# Patient Record
Sex: Female | Born: 1980 | Race: White | Hispanic: Yes | Marital: Single | Smoking: Never smoker
Health system: Southern US, Community
[De-identification: ages and names within clinical notes are randomized; demographics above are authoritative.]

## PROBLEM LIST (undated history)

## (undated) DIAGNOSIS — G43909 Migraine, unspecified, not intractable, without status migrainosus: Secondary | ICD-10-CM

## (undated) HISTORY — PX: LASER ABLATION: SHX1947

---
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
## 2001-10-12 ENCOUNTER — Emergency Department (HOSPITAL_COMMUNITY): Admission: EM | Admit: 2001-10-12 | Discharge: 2001-10-12 | Payer: Self-pay | Admitting: Emergency Medicine

## 2001-10-17 ENCOUNTER — Emergency Department (HOSPITAL_COMMUNITY): Admission: EM | Admit: 2001-10-17 | Discharge: 2001-10-17 | Payer: Self-pay | Admitting: *Deleted

## 2002-02-02 ENCOUNTER — Ambulatory Visit (HOSPITAL_COMMUNITY): Admission: RE | Admit: 2002-02-02 | Discharge: 2002-02-02 | Payer: Self-pay | Admitting: *Deleted

## 2002-06-24 ENCOUNTER — Inpatient Hospital Stay (HOSPITAL_COMMUNITY): Admission: AD | Admit: 2002-06-24 | Discharge: 2002-06-24 | Payer: Self-pay | Admitting: *Deleted

## 2002-07-01 ENCOUNTER — Encounter: Admission: RE | Admit: 2002-07-01 | Discharge: 2002-07-01 | Payer: Self-pay | Admitting: Obstetrics and Gynecology

## 2002-07-03 ENCOUNTER — Inpatient Hospital Stay (HOSPITAL_COMMUNITY): Admission: AD | Admit: 2002-07-03 | Discharge: 2002-07-05 | Payer: Self-pay | Admitting: Family Medicine

## 2003-12-23 ENCOUNTER — Emergency Department: Payer: Self-pay | Admitting: General Practice

## 2004-06-15 ENCOUNTER — Ambulatory Visit: Payer: Self-pay | Admitting: Certified Nurse Midwife

## 2004-06-15 ENCOUNTER — Inpatient Hospital Stay (HOSPITAL_COMMUNITY): Admission: AD | Admit: 2004-06-15 | Discharge: 2004-06-17 | Payer: Self-pay | Admitting: Obstetrics & Gynecology

## 2004-12-01 ENCOUNTER — Other Ambulatory Visit: Payer: Self-pay

## 2004-12-01 ENCOUNTER — Emergency Department: Payer: Self-pay | Admitting: Emergency Medicine

## 2005-02-06 ENCOUNTER — Emergency Department: Payer: Self-pay | Admitting: Emergency Medicine

## 2005-02-07 ENCOUNTER — Ambulatory Visit: Payer: Self-pay | Admitting: *Deleted

## 2005-08-05 ENCOUNTER — Emergency Department: Payer: Self-pay | Admitting: Emergency Medicine

## 2005-11-27 ENCOUNTER — Emergency Department: Payer: Self-pay | Admitting: Emergency Medicine

## 2007-01-05 ENCOUNTER — Emergency Department: Payer: Self-pay | Admitting: Unknown Physician Specialty

## 2009-09-15 ENCOUNTER — Emergency Department: Payer: Self-pay | Admitting: Emergency Medicine

## 2011-05-30 ENCOUNTER — Emergency Department: Payer: Self-pay | Admitting: Emergency Medicine

## 2014-08-13 ENCOUNTER — Encounter: Payer: Self-pay | Admitting: Emergency Medicine

## 2014-08-13 ENCOUNTER — Emergency Department
Admission: EM | Admit: 2014-08-13 | Discharge: 2014-08-13 | Disposition: A | Discharge status: Home / Self Care | Payer: Self-pay | Attending: Emergency Medicine | Admitting: Emergency Medicine

## 2014-08-13 DIAGNOSIS — G43909 Migraine, unspecified, not intractable, without status migrainosus: Secondary | ICD-10-CM | POA: Insufficient documentation

## 2014-08-13 HISTORY — DX: Migraine, unspecified, not intractable, without status migrainosus: G43.909

## 2014-08-13 MED ORDER — DIPHENHYDRAMINE HCL 25 MG PO CAPS: 25.0000 mg | ORAL_CAPSULE | ORAL | Status: DC | PRN

## 2014-08-13 MED ORDER — METOCLOPRAMIDE HCL 10 MG PO TABS
ORAL_TABLET | ORAL | Status: AC
  Administered 2014-08-13: 20 mg via ORAL
  Filled 2014-08-13: qty 2

## 2014-08-13 MED ORDER — DIPHENHYDRAMINE HCL 25 MG PO CAPS
ORAL_CAPSULE | ORAL | Status: AC
  Administered 2014-08-13: 25 mg via ORAL
  Filled 2014-08-13: qty 1

## 2014-08-13 MED ORDER — KETOROLAC TROMETHAMINE 60 MG/2ML IM SOLN
60.0000 mg | Freq: Once | INTRAMUSCULAR | Status: AC
  Administered 2014-08-13: 60 mg via INTRAMUSCULAR

## 2014-08-13 MED ORDER — KETOROLAC TROMETHAMINE 60 MG/2ML IM SOLN
INTRAMUSCULAR | Status: AC
  Administered 2014-08-13: 60 mg via INTRAMUSCULAR
  Filled 2014-08-13: qty 2

## 2014-08-13 MED ORDER — DIPHENHYDRAMINE HCL 25 MG PO CAPS
25.0000 mg | ORAL_CAPSULE | Freq: Once | ORAL | Status: AC
  Administered 2014-08-13: 25 mg via ORAL

## 2014-08-13 MED ORDER — KETOROLAC TROMETHAMINE 10 MG PO TABS: 10.0000 mg | ORAL_TABLET | Freq: Four times a day (QID) | ORAL | Status: DC | PRN

## 2014-08-13 MED ORDER — METOCLOPRAMIDE HCL 10 MG PO TABS: 10.0000 mg | ORAL_TABLET | Freq: Three times a day (TID) | ORAL | Status: DC

## 2014-08-13 MED ORDER — METOCLOPRAMIDE HCL 10 MG PO TABS
20.0000 mg | ORAL_TABLET | Freq: Once | ORAL | Status: AC
  Administered 2014-08-13: 20 mg via ORAL

## 2014-08-13 NOTE — ED Provider Notes (Signed)
[facility] Emergency Department Provider Note  ____________________________________________  Time seen: Approximately 1:11 PM  I have reviewed the triage vital signs and the nursing notes.   HISTORY  Chief Complaint Migraine    HPI Anne Bush is a 34 y.o. female resents to the emergency department for a migraine that is not responding to 800 mg of ibuprofen. Migraine is similar to the chronic migraine she experiences with the exception of not being relieved with ibuprofen. She states that this morning she began to feel nauseated and the photosensitivity worsened, so she decided to come to the emergency department.   Past Medical History  Diagnosis Date  . Migraines     There are no active problems to display for this patient.   History reviewed. No pertinent past surgical history.  Current Outpatient Rx  Name  Route  Sig  Dispense  Refill  . diphenhydrAMINE (BENADRYL) 25 mg capsule   Oral   Take 1 capsule (25 mg total) by mouth every 4 (four) hours as needed.   30 capsule   2   . ketorolac (TORADOL) 10 MG tablet   Oral   Take 1 tablet (10 mg total) by mouth every 6 (six) hours as needed.   20 tablet   0   . metoCLOPramide (REGLAN) 10 MG tablet   Oral   Take 1 tablet (10 mg total) by mouth 3 (three) times daily with meals.   90 tablet   1     Allergies Review of patient's allergies indicates no known allergies.  No family history on file.  Social History History  Substance Use Topics  . Smoking status: Never Smoker   . Smokeless tobacco: Never Used  . Alcohol Use: No    Review of Systems Constitutional: No fever/chills Eyes: No visual changes. ENT: No sore throat. Cardiovascular: Denies chest pain. Respiratory: Denies shortness of breath. Gastrointestinal: No abdominal pain.  No nausea, no vomiting.  No diarrhea.  No constipation. Genitourinary: Negative for dysuria. Musculoskeletal: Negative for back pain. Skin:  Negative for rash. Neurological: Positive for for headaches, negative for focal weakness or numbness.  10-point ROS otherwise negative.  ____________________________________________   PHYSICAL EXAM:  VITAL SIGNS: ED Triage Vitals  Enc Vitals Group     BP 08/13/14 1111 112/84 mmHg     Pulse Rate 08/13/14 1111 60     Resp 08/13/14 1111 20     Temp 08/13/14 1111 97.8 F (36.6 C)     Temp Source 08/13/14 1111 Oral     SpO2 08/13/14 1111 100 %     Weight 08/13/14 1111 168 lb (76.204 kg)     Height 08/13/14 1111  (1.626 m)     Head Cir --      Peak Flow --      Pain Score 08/13/14 1122 10     Pain Loc --      Pain Edu? --      Excl. in GC? --     Constitutional: Alert and oriented. Well appearing and in no acute distress. Eyes: Conjunctivae are normal. PERRL. EOMI. Sensitivity to light on exam. No papillary edema on limited exam. Head: Atraumatic. Nose: No congestion/rhinnorhea. Mouth/Throat: Mucous membranes are moist.  Oropharynx non-erythematous. Neck: No stridor.   Cardiovascular: Normal rate, regular rhythm. Grossly normal heart sounds.  Good peripheral circulation. Respiratory: Normal respiratory effort.  No retractions. Lungs CTAB. Gastrointestinal: Soft and nontender. No distention. No abdominal bruits. No CVA tenderness. Musculoskeletal: No lower extremity  tenderness nor edema.  No joint effusions. Neurologic:  Normal speech and language. No gross focal neurologic deficits are appreciated. Speech is normal. No gait instability. Skin:  Skin is warm, dry and intact. No rash noted. Psychiatric: Mood and affect are normal. Speech and behavior are normal.  ____________________________________________   LABS (all labs ordered are listed, but only abnormal results are displayed)  Labs Reviewed - No data to display ____________________________________________  EKG   ____________________________________________  RADIOLOGY  Not  indicated ____________________________________________   PROCEDURES  Procedure(s) performed: None  Critical Care performed: No  ____________________________________________   INITIAL IMPRESSION / ASSESSMENT AND PLAN / ED COURSE  Pertinent labs & imaging results that were available during my care of the patient were reviewed by me and considered in my medical decision making (see chart for details).  IM Toradol, po benadryl, and reglan given with near complete relief of pain. Patient to be discharged home to follow up with her primary care provider or return to the ER for symptoms that change or worsen. ____________________________________________   FINAL CLINICAL IMPRESSION(S) / ED DIAGNOSES  Final diagnoses:  Migraine without status migrainosus, not intractable, unspecified migraine type      Chinita Pester, FNP 08/13/14 1806  Governor Rooks, MD 08/14/14 1447

## 2014-08-13 NOTE — Discharge Instructions (Signed)
Cefalea migrañosa °(Migraine Headache) °Una cefalea migrañosa es un dolor intenso y punzante en uno o ambos lados de la cabeza. La migraña puede durar desde 30 minutos hasta varias horas. °CAUSAS  °No siempre se conoce la causa exacta de la cefalea migrañosa. Sin embargo, pueden aparecer cuando los nervios del cerebro se irritan y liberan ciertas sustancias químicas que causan inflamación. Esto ocasiona dolor. °Existen también ciertos factores que pueden desencadenar las migrañas, como los siguientes: °· Alcohol. °· Fumar. °· Estrés. °· La menstruación °· Quesos añejados. °· Los alimentos o las bebidas que contienen nitratos, glutamato, aspartamo o tiramina. °· Falta de sueño. °· Chocolate. °· Cafeína. °· Hambre. °· Actividad física extenuante. °· Fatiga. °· Medicamentos que se usan para tratar el dolor en el pecho (nitroglicerina), píldoras anticonceptivas, estrógeno y algunos medicamentos para la hipertensión arterial. °SIGNOS Y SÍNTOMAS °· Dolor en uno o ambos lados de la cabeza. °· Dolor pulsante o punzante. °· Dolor intenso que impide realizar las actividades diarias. °· Dolor que se agrava por cualquier actividad física. °· Náuseas, vómitos o ambos. °· Mareos. °· Dolor con la exposición a las luces brillantes, a los ruidos fuertes o la actividad. °· Sensibilidad general a las luces brillantes, a los ruidos fuertes o a los olores. °Antes de sufrir una migraña, puede recibir señales de advertencia (aura). Un aura puede incluir: °· Ver las luces intermitentes. °· Ver puntos brillantes, halos o líneas en zigzag. °· Tener una visión en túnel o visión borrosa. °· Sensación de entumecimiento u hormigueo. °· Dificultad para hablar °· Debilidad muscular. °DIAGNÓSTICO  °La cefalea migrañosa se diagnostica en función de lo siguiente: °· Síntomas. °· Examen físico. °· Una TC (tomografía computada) o resonancia magnética de la cabeza. Estas pruebas de diagnóstico por imagen no pueden diagnosticar las migrañas, pero pueden  ayudar a descartar otras causas de las cefaleas. °TRATAMIENTO °Le prescribirán medicamentos para aliviar el dolor y las náuseas. También podrán administrarse medicamentos para ayudar a prevenir las migrañas recurrentes.  °INSTRUCCIONES PARA EL CUIDADO EN EL HOGAR °· Sólo tome medicamentos de venta libre o recetados para calmar el dolor o el malestar, según las indicaciones de su médico. No se recomienda usar los opiáceos a largo plazo. °· Cuando tenga la migraña, acuéstese en un cuarto oscuro y tranquilo °· Lleve un registro diario para averiguar lo que puede provocar las cefaleas migrañosas. Por ejemplo, escriba: °¨ Lo que usted come y bebe. °¨ Cuánto tiempo duerme. °¨ Algún cambio en su dieta o en los medicamentos. °· Limite el consumo de bebidas alcohólicas. °· Si fuma, deje de hacerlo. °· Duerma entre 7 y 9 horas, o según las recomendaciones del médico. °· Limite el estrés. °· Mantenga las luces tenues si le molestan las luces brillantes y la migraña empeora. °SOLICITE ATENCIÓN MÉDICA DE INMEDIATO SI:  °· La migraña se hace cada vez más intensa. °· Tiene fiebre. °· Presenta rigidez en el cuello. °· Tiene pérdida de visión. °· Presenta debilidad muscular o pérdida del control muscular. °· Comienza a perder el equilibrio o tiene problemas para caminar. °· Sufre mareos o se desmaya. °· Tiene síntomas graves que son diferentes a los primeros síntomas. °ASEGÚRESE DE QUE:  °· Comprende estas instrucciones. °· Controlará su afección. °· Recibirá ayuda de inmediato si no mejora o si empeora. °Document Released: 02/10/2005 Document Revised: 12/01/2012 °ExitCare® Patient Information ©2015 [employer]. This information is not intended to replace advice given to you by your health care provider. Make sure you discuss any questions you have with your   health care provider. ° ° °

## 2014-08-13 NOTE — ED Notes (Signed)
Pt reports for past 5 days she had been having headaches. Pt reports 2 days a ago she had a nose bleed. Pt reports light sensitivity and nausea. Pt reports history of migraines headaches and has been treated with an IM injection for the pain.

## 2015-03-09 ENCOUNTER — Emergency Department: Payer: Commercial Managed Care - PPO

## 2015-03-09 ENCOUNTER — Emergency Department
Admission: EM | Admit: 2015-03-09 | Discharge: 2015-03-09 | Disposition: A | Discharge status: Home / Self Care | Payer: Commercial Managed Care - PPO | Attending: Emergency Medicine | Admitting: Emergency Medicine

## 2015-03-09 DIAGNOSIS — Z79899 Other long term (current) drug therapy: Secondary | ICD-10-CM | POA: Diagnosis not present

## 2015-03-09 DIAGNOSIS — R110 Nausea: Secondary | ICD-10-CM | POA: Insufficient documentation

## 2015-03-09 DIAGNOSIS — R1031 Right lower quadrant pain: Secondary | ICD-10-CM | POA: Diagnosis not present

## 2015-03-09 DIAGNOSIS — Z3202 Encounter for pregnancy test, result negative: Secondary | ICD-10-CM | POA: Diagnosis not present

## 2015-03-09 LAB — URINALYSIS COMPLETE WITH MICROSCOPIC (ARMC ONLY)
BILIRUBIN URINE: NEGATIVE
Glucose, UA: NEGATIVE mg/dL
HGB URINE DIPSTICK: NEGATIVE
LEUKOCYTES UA: NEGATIVE
NITRITE: NEGATIVE
PH: 5 (ref 5.0–8.0)
PROTEIN: NEGATIVE mg/dL
SPECIFIC GRAVITY, URINE: 1.029 (ref 1.005–1.030)

## 2015-03-09 LAB — CBC
HCT: 39.9 % (ref 35.0–47.0)
HEMOGLOBIN: 13.7 g/dL (ref 12.0–16.0)
MCH: 29.7 pg (ref 26.0–34.0)
MCHC: 34.3 g/dL (ref 32.0–36.0)
MCV: 86.6 fL (ref 80.0–100.0)
PLATELETS: 287 10*3/uL (ref 150–440)
RBC: 4.61 MIL/uL (ref 3.80–5.20)
RDW: 12.9 % (ref 11.5–14.5)
WBC: 8.3 10*3/uL (ref 3.6–11.0)

## 2015-03-09 LAB — COMPREHENSIVE METABOLIC PANEL
ALK PHOS: 100 U/L (ref 38–126)
ALT: 17 U/L (ref 14–54)
ANION GAP: 6 (ref 5–15)
AST: 18 U/L (ref 15–41)
Albumin: 4.4 g/dL (ref 3.5–5.0)
BUN: 12 mg/dL (ref 6–20)
CALCIUM: 9.2 mg/dL (ref 8.9–10.3)
CO2: 27 mmol/L (ref 22–32)
CREATININE: 0.63 mg/dL (ref 0.44–1.00)
Chloride: 104 mmol/L (ref 101–111)
Glucose, Bld: 93 mg/dL (ref 65–99)
Potassium: 3.9 mmol/L (ref 3.5–5.1)
SODIUM: 137 mmol/L (ref 135–145)
TOTAL PROTEIN: 7.9 g/dL (ref 6.5–8.1)
Total Bilirubin: 0.9 mg/dL (ref 0.3–1.2)

## 2015-03-09 LAB — POCT PREGNANCY, URINE: Preg Test, Ur: NEGATIVE

## 2015-03-09 LAB — LIPASE, BLOOD: Lipase: 25 U/L (ref 11–51)

## 2015-03-09 MED ORDER — MORPHINE SULFATE (PF) 4 MG/ML IV SOLN
4.0000 mg | Freq: Once | INTRAVENOUS | Status: AC
  Administered 2015-03-09: 4 mg via INTRAVENOUS
  Filled 2015-03-09: qty 1

## 2015-03-09 MED ORDER — ONDANSETRON HCL 4 MG PO TABS: 4.0000 mg | ORAL_TABLET | Freq: Every day | ORAL | Status: DC | PRN

## 2015-03-09 MED ORDER — IOHEXOL 240 MG/ML SOLN
25.0000 mL | Freq: Once | INTRAMUSCULAR | Status: AC | PRN
  Administered 2015-03-09: 25 mL via ORAL

## 2015-03-09 MED ORDER — KETOROLAC TROMETHAMINE 30 MG/ML IJ SOLN
30.0000 mg | Freq: Once | INTRAMUSCULAR | Status: AC
  Administered 2015-03-09: 30 mg via INTRAVENOUS

## 2015-03-09 MED ORDER — ONDANSETRON HCL 4 MG/2ML IJ SOLN
4.0000 mg | Freq: Once | INTRAMUSCULAR | Status: AC
  Administered 2015-03-09: 4 mg via INTRAVENOUS
  Filled 2015-03-09: qty 2

## 2015-03-09 MED ORDER — SODIUM CHLORIDE 0.9 % IV BOLUS (SEPSIS)
1000.0000 mL | Freq: Once | INTRAVENOUS | Status: AC
  Administered 2015-03-09: 1000 mL via INTRAVENOUS

## 2015-03-09 MED ORDER — IOHEXOL 300 MG/ML  SOLN
100.0000 mL | Freq: Once | INTRAMUSCULAR | Status: AC | PRN
  Administered 2015-03-09: 100 mL via INTRAVENOUS

## 2015-03-09 MED ORDER — ONDANSETRON 4 MG PO TBDP
ORAL_TABLET | ORAL | Status: AC
  Filled 2015-03-09: qty 1

## 2015-03-09 MED ORDER — KETOROLAC TROMETHAMINE 30 MG/ML IJ SOLN
INTRAMUSCULAR | Status: AC
  Administered 2015-03-09: 30 mg via INTRAVENOUS
  Filled 2015-03-09: qty 1

## 2015-03-09 NOTE — ED Notes (Addendum)
States RLQ abd pain radiating down her leg and hip, states nausea, states started yesterday, denies any urinary symptoms, denies any vaginal discharge

## 2015-03-09 NOTE — Discharge Instructions (Signed)
CT scan: Negative Ultrasound: Negative Blood tests and urine: Normal It is unclear what is causing your discomfort at this time. Take Zofran for nausea. Return to the emergency department if you have worsening pain or other concerns. Follow-up with your regular doctor early this coming week if you have ongoing symptoms.  No est claro qu est causando su malestar en TRW Automotiveeste momento. Tome Zofran para las nuseas. Vuelva al departamento de emergencias si tiene dolor u otras preocupaciones. Haga un seguimiento con su mdico habitual a principios de la prxima semana si tiene sntomas en curso.   Dolor abdominal en adultos (Abdominal Pain, Adult) El dolor de estmago (abdominal) puede tener muchas causas. La mayora de las veces, el dolor de Indianolaestmago no es peligroso. Muchos de Franklin Resourcesestos casos de dolor de estmago pueden controlarse y tratarse en casa. CUIDADOS EN EL HOGAR   No tome medicamentos que lo ayuden a defecar (laxantes), salvo que su mdico se lo indique.  Solo tome los medicamentos que le haya indicado su mdico.  Coma o beba lo que le indique su mdico. Su mdico le dir si debe seguir una dieta especial. SOLICITE AYUDA SI:  No sabe cul es la causa del dolor de Hedrickestmago.  Tiene dolor de estmago cuando siente ganas de vomitar (nuseas) o tiene colitis (diarrea).  Tiene dolor durante la miccin o la evacuacin.  El dolor de estmago lo despierta de noche.  Tiene dolor de Mirantestmago que empeora o [place]mejora cuando come.  Tiene dolor de Mirantestmago que empeora cuando come CIGNAalimentos grasosos.  Tiene fiebre. SOLICITE AYUDA DE INMEDIATO SI:   El dolor no desaparece en un plazo mximo de 2horas.  No deja de (vomitar).  El dolor cambia y se Librarian, academiclocaliza solo en la parte derecha o izquierda del Rosslyn Farmsestmago.  La materia fecal es sanguinolenta o de aspecto alquitranado. ASEGRESE DE QUE:   Comprende estas instrucciones.  Controlar su afeccin.  Recibir ayuda de inmediato si no mejora o si  empeora.   Esta informacin no tiene Theme park managercomo fin reemplazar el consejo del mdico. Asegrese de hacerle al mdico cualquier pregunta que tenga.   Document Released: 05/09/2008 Document Revised: 03/03/2014 Elsevier Interactive Patient Education [employer]2016 Elsevier Inc.

## 2015-03-09 NOTE — ED Notes (Signed)
Pt c/o RLQ pain that radiates into the back with nausea since yesterday.. Denies vomiting or diarrhea

## 2015-03-09 NOTE — ED Provider Notes (Signed)
-----------------------------------------   4:59 PM on 03/09/2015 -----------------------------------------  Accepted signout from Dr. Fanny BienQuale earlier at the end of his shift. CT of abdomen and ultrasound of pelvis ordered by him and pending.  At this time, the results of the CT show a normal appendix and no acute findings.  CT Abd & Pelvis: FINDINGS: Negative lung bases.  No osseous abnormality identified.  No pelvic free fluid. Negative uterus and adnexa. Diminutive urinary bladder. Decompressed rectum and sigmoid colon.  Negative left colon and transverse colon. Negative right colon. Normal appendix (series 2, image 53). Normal terminal ileum. Oral contrast has not yet reached the distal small bowel. No dilated or inflamed small bowel loops identified. Negative stomach and duodenum.  No abdominal free air or free fluid. Liver, gallbladder, spleen, pancreas, and adrenal glands are within normal limits. Portal venous system appears patent. Major arterial structures are patent. Both kidneys appear normal. No lymphadenopathy. No mesenteric stranding identified.  IMPRESSION: Normal appendix. Normal CT Abdomen and Pelvis.  ----------------------------------------- 6:32 PM on 03/09/2015 -----------------------------------------  Pelvic ultrasound: FINDINGS: Uterus  Measurements: 6.9 x 4.9 x 5.1 cm. No fibroids or other mass visualized.  Endometrium  Thickness: 10 mm. No focal abnormality visualized.  Right ovary  Measurements: 3.6 x 1.4 x 2.8 cm. Normal appearance/no adnexal mass.  Left ovary  Measurements: 2.1 x 1.4 x 2.0 cm. Normal appearance/no adnexal mass.  Pulsed Doppler evaluation of both ovaries demonstrates normal low-resistance arterial and venous waveforms. The peak systolic velocity in the right ovary is 10 cm/sec. The peak systolic velocity in the left ovary is 7 cm/sec.  Other findings  No abnormal free fluid.  IMPRESSION: Study within normal  limits.   ~~~~~~~~~~~~~~~~~~~~~~~~   Ultrasound, as noted above, is negative.  Reexamination this time finds the patient overall comfortable. She does report she has a bit of nausea. We'll treat her with additional Zofran and right upper prescription for further. I've informed her of the results of her testing, reasons to return to the emergency department, and reasons to [facility].   Diagnosis: Abdominal pain, right lower quadrant - unknown etiology      Darien Ramusavid W Kratos Ruscitti, MD 03/09/15 1850

## 2015-03-09 NOTE — ED Notes (Signed)
Pt discharged to home.  Family member driving.  Discharge instructions reviewed.  Verbalized understanding.  No questions or concerns at this time.  Teach back verified.  Pt in NAD.  No items left in ED.   

## 2015-03-09 NOTE — ED Notes (Signed)
Pt returned from CT and US, resting in bed, family at bedside

## 2015-03-09 NOTE — ED Provider Notes (Signed)
[facility] Emergency Department Provider Note  ____________________________________________  Time seen: Approximately 2:18 PM  I have reviewed the triage vital signs and the nursing notes.   HISTORY  Chief Complaint Abdominal Pain  I offered the patient is Spanish interpreter, she reports she does not wish for one. She does appear to speak [place] and understand with excellent fluency.  HPI Anne Bush is a 35 y.o. female reports no see if it medical history.  Patient reports that yesterday she went to work, during the later part of her day she began feeling tired and nauseated and having a little bit of aching in the lower abdomen. Then throughout the night she developed ongoing and increasing pain in the right lower part of the stomach. She reports that when she stands or walks or moves she gets pain in the right lower abdomen that shoots down somewhat into the right leg. This is associated with nausea but no vomiting. No fever that she is aware of, but she does feel chilled and generally "sick". No chest pain or trouble breathing. Denies pregnancy. No vaginal discharge or pelvic pain.   Denies any previous surgeries. 3 prior pregnancies. No pain or burning with urination.    Past Medical History  Diagnosis Date  . Migraines     There are no active problems to display for this patient.   History reviewed. No pertinent past surgical history.  Current Outpatient Rx  Name  Route  Sig  Dispense  Refill  . diphenhydrAMINE (BENADRYL) 25 mg capsule   Oral   Take 1 capsule (25 mg total) by mouth every 4 (four) hours as needed.   30 capsule   2   . ketorolac (TORADOL) 10 MG tablet   Oral   Take 1 tablet (10 mg total) by mouth every 6 (six) hours as needed.   20 tablet   0   . metoCLOPramide (REGLAN) 10 MG tablet   Oral   Take 1 tablet (10 mg total) by mouth 3 (three) times daily with meals.   90 tablet   1     Allergies Review of  patient's allergies indicates no known allergies.  No family history on file.  Social History Social History  Substance Use Topics  . Smoking status: Never Smoker   . Smokeless tobacco: Never Used  . Alcohol Use: No    Review of Systems Constitutional: Some chills possibly Eyes: No visual changes. ENT: No sore throat. Cardiovascular: Denies chest pain. Respiratory: Denies shortness of breath. Gastrointestinal: No vomiting.  No diarrhea.  No constipation. Genitourinary: Negative for dysuria. Musculoskeletal: Negative for back pain. Skin: Negative for rash. No numbness or tingling in the hands feet or legs. She denies pain is in her back. She reports it feels like the pain in her lower stomach shoots down or she moves or presses on it.  Neurological: Negative for headaches, focal weakness or numbness.  10-point ROS otherwise negative.  ____________________________________________   PHYSICAL EXAM:  VITAL SIGNS: ED Triage Vitals  Enc Vitals Group     BP 03/09/15 1126 115/74 mmHg     Pulse Rate 03/09/15 1126 61     Resp 03/09/15 1126 14     Temp 03/09/15 1126 97.7 F (36.5 C)     Temp Source 03/09/15 1126 Oral     SpO2 03/09/15 1126 100 %     Weight 03/09/15 1126 168 lb (76.204 kg)     Height 03/09/15 1126 5\' 4"  (1.626 m)  Head Cir --      Peak Flow --      Pain Score 03/09/15 1136 10     Pain Loc --      Pain Edu? --      Excl. in GC? --    Constitutional: Alert and oriented. Well appearing and in no acute distress. Eyes: Conjunctivae are normal. PERRL. EOMI. Head: Atraumatic. Nose: No congestion/rhinnorhea. Mouth/Throat: Mucous membranes are moist.  Oropharynx non-erythematous. Neck: No stridor.   Cardiovascular: Normal rate, regular rhythm. Grossly normal heart sounds.  Good peripheral circulation. Respiratory: Normal respiratory effort.  No retractions. Lungs CTAB. Gastrointestinal: Soft but does have focal and moderate tenderness in the right lower  abdomen with some mild voluntary guarding and no rebound tenderness. Negative Murphy. There is pain that seems to localize well to McBurney's point. No distention. No abdominal bruits. No CVA tenderness. Musculoskeletal: No lower extremity tenderness nor edema.  No joint effusions. No pain along the back, no tenderness along the lumbar spine or paraspinous muscles. She has strong and normal dorsalis pedis and posterior tibial pulses bilateral. Neurologic:  Normal speech and language. No gross focal neurologic deficits are appreciated. Skin:  Skin is warm, dry and intact. No rash noted. Psychiatric: Mood and affect are normal. Speech and behavior are normal.  ____________________________________________   LABS (all labs ordered are listed, but only abnormal results are displayed)  Labs Reviewed  URINALYSIS COMPLETEWITH MICROSCOPIC ([facility] ONLY) - Abnormal; Notable for the following:    Color, Urine YELLOW (*)    APPearance CLOUDY (*)    Ketones, ur TRACE (*)    Bacteria, UA FEW (*)    Squamous Epithelial / LPF 6-30 (*)    All other components within normal limits  LIPASE, BLOOD  COMPREHENSIVE METABOLIC PANEL  CBC  POC URINE PREG, ED  POCT PREGNANCY, URINE   ____________________________________________  EKG   ____________________________________________  RADIOLOGY   ____________________________________________   PROCEDURES  Procedure(s) performed: None  Critical Care performed: No  ____________________________________________   INITIAL IMPRESSION / ASSESSMENT AND PLAN / ED COURSE  Pertinent labs & imaging results that were available during my care of the patient were reviewed by me and considered in my medical decision making (see chart for details).  Patient presents for evaluation of increasing right lower quadrant abdominal pain associated chills and nausea. Her exam is concerning and she does have tenderness. He barely located at McBurney's point. She denies any  gynecologic symptoms, but given her age consideration for ovarian cyst or torsion is also considered though felt less likely than possible colitis, appendicitis, or other acute intra-abdominal pathology. Her exam does not seem to suggest musculoskeletal or nerve impingement/radiculopathy rather her pain all seems to be centered around and worsened by a patient of the lower abdomen. Urinalysis negative for clear evidence of infection. We will proceed with CT abdomen and pelvis as well as ultrasound the right lower quadrant further evaluate for cause.  ----------------------------------------- 3:34 PM on 03/09/2015 -----------------------------------------  Ongoing care and disposition assigned to Dr. Janalyn Harder. Follow-up on ultrasound and CT imaging. Reevaluate. ____________________________________________   FINAL CLINICAL IMPRESSION(S) / ED DIAGNOSES  Final diagnoses:  RLQ abdominal pain      Sharyn Creamer, MD 03/09/15 1534

## 2015-03-09 NOTE — ED Notes (Signed)
Patient transported to CT 

## 2016-09-04 ENCOUNTER — Ambulatory Visit (INDEPENDENT_AMBULATORY_CARE_PROVIDER_SITE_OTHER): Payer: Commercial Managed Care - PPO | Admitting: Licensed Clinical Social Worker

## 2016-09-04 DIAGNOSIS — F4323 Adjustment disorder with mixed anxiety and depressed mood: Secondary | ICD-10-CM | POA: Diagnosis not present
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
## 2016-09-15 ENCOUNTER — Ambulatory Visit: Payer: Self-pay | Admitting: Licensed Clinical Social Worker

## 2016-09-15 ENCOUNTER — Ambulatory Visit (INDEPENDENT_AMBULATORY_CARE_PROVIDER_SITE_OTHER): Payer: Commercial Managed Care - PPO | Admitting: Licensed Clinical Social Worker

## 2016-09-15 DIAGNOSIS — F4323 Adjustment disorder with mixed anxiety and depressed mood: Secondary | ICD-10-CM | POA: Diagnosis not present

## 2016-09-22 NOTE — Progress Notes (Signed)
   THERAPIST PROGRESS NOTE  Session Time: 60  Participation Level: Active  Behavioral Response: Casual and NeatAlertDepressed  Type of Therapy: Individual Therapy  Treatment Goals addressed: Coping  Interventions: CBT, Solution Focused and Supportive  Summary: Anne Bush is a 36 y.o. female who presents with continued symptoms of her diagnosis.  Therapist met with Patient in an outpatient setting to assess current mood and assist with making progress towards her goals through the use of therapeutic intervention. Therapist did a brief mood check, assessing anger, fear, disgust, excitement, happiness, and sadness. Therapist provided active listening for Patient as she communicated her current stressors (husband infidelity on father's day 2018).  Therapist explained the importance of establishing emotional support as well as ensuring that Patient is engaging in adequate self-care during these stress induced times. Therapist then assisted Patient with brainstorming to determine a solution for her current work problem. Therapist and Patient discussed possible financial supports, emotional supports, family, friends, etc.    Suicidal/Homicidal: No  Therapist Response: LCSW discussed what psychotherapy is and is not and the importance of the therapeutic relationship to include open and honest communication between client and therapist and building trust.  Reviewed advantages and disadvantages of the therapeutic process and limitations to the therapeutic relationship including LCSW's role in maintaining the safety of the client, others and those in client's care.   Plan: Return again in 2 weeks.  Diagnosis: Axis I: Adjustment Disorder with Mixed Emotional Features    Axis II: No diagnosis    Nicole M Peacock, LCSW 09/16/2016  

## 2016-09-30 ENCOUNTER — Ambulatory Visit: Payer: Commercial Managed Care - PPO | Admitting: Licensed Clinical Social Worker

## 2017-06-29 ENCOUNTER — Ambulatory Visit (INDEPENDENT_AMBULATORY_CARE_PROVIDER_SITE_OTHER): Payer: Commercial Managed Care - PPO | Admitting: Vascular Surgery

## 2017-06-29 ENCOUNTER — Encounter (INDEPENDENT_AMBULATORY_CARE_PROVIDER_SITE_OTHER): Payer: Self-pay | Admitting: Vascular Surgery

## 2017-06-29 VITALS — BP 113/71 | HR 78 | Resp 17 | Ht 62.5 in | Wt 151.6 lb

## 2017-06-29 DIAGNOSIS — I83813 Varicose veins of bilateral lower extremities with pain: Secondary | ICD-10-CM | POA: Insufficient documentation

## 2017-06-29 DIAGNOSIS — I872 Venous insufficiency (chronic) (peripheral): Secondary | ICD-10-CM

## 2017-06-29 NOTE — Progress Notes (Signed)
Subjective:    Patient ID: Anne Bush, female    DOB: 03/03/1980, 37 y.o.   MRN: [id number] Chief Complaint  Patient presents with  . Varicose Veins    est pt from 2016   The patient was last seen in 2016.  The patient has undergone endovenous laser ablation in the past.  Since that time, the patient has been engaging in conservative therapy including wearing medical grade 1 compression socks, elevating her legs and remaining active however she has noticed an increase in the appearance as well as discomfort of the varicosities noted to her bilateral legs.  The patient notes she has gained some weight recently and feels that this is a contributing factor.  The patient's discomfort is located mostly behind her knees along the varicosities located there.  The patient notes that her pain worsens towards the end of the day.  Her discomfort also worsens with sitting and standing for long periods of time.  The patient feels that her symptoms have progressed to the point that she is unable to function on a daily basis.  She feels that her symptoms are not lifestyle limiting.  The patient denies any claudication-like symptoms, rest pain or ulceration to the bilateral lower extremity.  The patient denies any recent surgery/trauma to the bilateral legs.  The patient denies any fever, nausea or vomiting.  Review of Systems  Constitutional: Negative.   HENT: Negative.   Eyes: Negative.   Respiratory: Negative.   Cardiovascular:       Painful varicose veins Chronic venous insufficiency Lymphedema   Gastrointestinal: Negative.   Endocrine: Negative.   Genitourinary: Negative.   Musculoskeletal: Negative.   Skin: Negative.   Allergic/Immunologic: Negative.   Neurological: Negative.   Hematological: Negative.   Psychiatric/Behavioral: Negative.       Objective:   Physical Exam  Constitutional: She is oriented to person, place, and time. She appears well-developed and well-nourished. No  distress.  HENT:  Head: Normocephalic and atraumatic.  Right Ear: External ear normal.  Left Ear: External ear normal.  Eyes: Pupils are equal, round, and reactive to light. Conjunctivae and EOM are normal.  Neck: Normal range of motion.  Cardiovascular: Normal rate, regular rhythm and normal heart sounds.  Pulses:      Radial pulses are 2+ on the right side, and 2+ on the left side.       Dorsalis pedis pulses are 2+ on the right side, and 2+ on the left side.       Posterior tibial pulses are 2+ on the right side, and 2+ on the left side.  Pulmonary/Chest: Effort normal and breath sounds normal.  Musculoskeletal: Normal range of motion. She exhibits no edema.  Neurological: She is alert and oriented to person, place, and time.  Skin: Skin is warm and dry. She is not diaphoretic.  Patient with diffuse greater than 1 cm and less than 1 cm varicosities noted to the bilateral lower extremity.  There is no stasis dermatitis, skin thickening, cellulitis or ulceration noted to the bilateral lower extremity.  Psychiatric: She has a normal mood and affect. Her behavior is normal. Judgment and thought content normal.  Vitals reviewed.  BP 113/71 (BP Location: Right Arm)   Pulse 78   Resp 17   Ht 5' 2.5" (1.588 m)   Wt 151 lb 9.6 oz (68.8 kg)   BMI 27.29 kg/m   Past Medical History:  Diagnosis Date  . Migraines    Social History  Socioeconomic History  . Marital status: Single    Spouse name: Not on file  . Number of children: Not on file  . Years of education: Not on file  . Highest education level: Not on file  Occupational History  . Not on file  Social Needs  . Financial resource strain: Not on file  . Food insecurity:    Worry: Not on file    Inability: Not on file  . Transportation needs:    Medical: Not on file    Non-medical: Not on file  Tobacco Use  . Smoking status: Never Smoker  . Smokeless tobacco: Never Used  Substance and Sexual Activity  . Alcohol use: No   . Drug use: No  . Sexual activity: Yes    Birth control/protection: None  Lifestyle  . Physical activity:    Days per week: Not on file    Minutes per session: Not on file  . Stress: Not on file  Relationships  . Social connections:    Talks on phone: Not on file    Gets together: Not on file    Attends religious service: Not on file    Active member of club or organization: Not on file    Attends meetings of clubs or organizations: Not on file    Relationship status: Not on file  . Intimate partner violence:    Fear of current or ex partner: Not on file    Emotionally abused: Not on file    Physically abused: Not on file    Forced sexual activity: Not on file  Other Topics Concern  . Not on file  Social History Narrative  . Not on file   Past Surgical History:  Procedure Laterality Date  . LASER ABLATION     Family History  Problem Relation Age of Onset  . Varicose Veins Mother   . Hypertension Mother   . Hyperlipidemia Father   . Varicose Veins Maternal Grandmother   . Cancer Maternal Grandmother    No Known Allergies     Assessment & Plan:  The patient was last seen in 2016.  The patient has undergone endovenous laser ablation in the past.  Since that time, the patient has been engaging in conservative therapy including wearing medical grade 1 compression socks, elevating her legs and remaining active however she has noticed an increase in the appearance as well as discomfort of the varicosities noted to her bilateral legs.  The patient notes she has gained some weight recently and feels that this is a contributing factor.  The patient's discomfort is located mostly behind her knees along the varicosities located there.  The patient notes that her pain worsens towards the end of the day.  Her discomfort also worsens with sitting and standing for long periods of time.  The patient feels that her symptoms have progressed to the point that she is unable to function on a  daily basis.  She feels that her symptoms are not lifestyle limiting.  The patient denies any claudication-like symptoms, rest pain or ulceration to the bilateral lower extremity.  The patient denies any recent surgery/trauma to the bilateral legs.  The patient denies any fever, nausea or vomiting.  1. Chronic venous insufficiency - Stable Patient was last seen in 2016. Patient is status post laser ablation to the great saphenous vein.  2. Varicose veins of bilateral lower extremities with pain - New As above. The patient has noted progressively more discomfort associated to the varicosities  noted in her lower extremity. The patient is also noted that her varicosities have become bigger and there are now more. I will bring the patient back to undergo bilateral lower extremity venous duplex to assess the patient's anatomy and degree of venous disease. Based on what the duplex shows will dictate how we move forward In the meantime, the patient is to continue wearing medical grade 1 compression socks, elevating her legs and remaining active The patient was instructed to call the office in the interim if any worsening edema or ulcerations to the legs, feet or toes occurs. The patient expresses their understanding.  Current Outpatient Medications on File Prior to Visit  Medication Sig Dispense Refill  . fexofenadine (ALLEGRA) 60 MG tablet Take 60 mg by mouth as needed for allergies or rhinitis.    Marland Kitchen ibuprofen (ADVIL,MOTRIN) 800 MG tablet Take by mouth.    . diphenhydrAMINE (BENADRYL) 25 mg capsule Take 1 capsule (25 mg total) by mouth every 4 (four) hours as needed. 30 capsule 2  . ketorolac (TORADOL) 10 MG tablet Take 1 tablet (10 mg total) by mouth every 6 (six) hours as needed. (Patient not taking: Reported on 06/29/2017) 20 tablet 0  . metoCLOPramide (REGLAN) 10 MG tablet Take 1 tablet (10 mg total) by mouth 3 (three) times daily with meals. 90 tablet 1  . ondansetron (ZOFRAN) 4 MG tablet Take 1  tablet (4 mg total) by mouth daily as needed for nausea or vomiting. (Patient not taking: Reported on 06/29/2017) 10 tablet 0   No current facility-administered medications on file prior to visit.    There are no Patient Instructions on file for this visit. No follow-ups on file.  KIMBERLY A STEGMAYER, PA-C

## 2017-07-21 ENCOUNTER — Other Ambulatory Visit (INDEPENDENT_AMBULATORY_CARE_PROVIDER_SITE_OTHER): Payer: Self-pay | Admitting: Vascular Surgery

## 2017-07-21 DIAGNOSIS — I8393 Asymptomatic varicose veins of bilateral lower extremities: Secondary | ICD-10-CM

## 2017-07-22 ENCOUNTER — Ambulatory Visit (INDEPENDENT_AMBULATORY_CARE_PROVIDER_SITE_OTHER): Payer: Commercial Managed Care - PPO | Admitting: Vascular Surgery

## 2017-07-22 ENCOUNTER — Ambulatory Visit (INDEPENDENT_AMBULATORY_CARE_PROVIDER_SITE_OTHER): Payer: Commercial Managed Care - PPO

## 2017-07-22 ENCOUNTER — Encounter (INDEPENDENT_AMBULATORY_CARE_PROVIDER_SITE_OTHER): Payer: Self-pay | Admitting: Vascular Surgery

## 2017-07-22 VITALS — BP 104/71 | HR 70 | Resp 16 | Ht 62.5 in | Wt 154.0 lb

## 2017-07-22 DIAGNOSIS — I83813 Varicose veins of bilateral lower extremities with pain: Secondary | ICD-10-CM

## 2017-07-22 DIAGNOSIS — I872 Venous insufficiency (chronic) (peripheral): Secondary | ICD-10-CM | POA: Diagnosis not present

## 2017-07-22 DIAGNOSIS — I8393 Asymptomatic varicose veins of bilateral lower extremities: Secondary | ICD-10-CM | POA: Diagnosis not present

## 2017-07-22 NOTE — Progress Notes (Signed)
Subjective:    Patient ID: Anne Bush, female    DOB: 30-Jun-1980, 37 y.o.   MRN: [id number] Chief Complaint  Patient presents with  . Follow-up    pt conv bil ven reflux   She presents to review vascular studies.  The patient was last seen on Jun 29, 2017 for evaluation of painful varicose veins to the bilateral lower extremity.  The patient does have a past medical history of undergoing bilateral great saphenous vein ablation.  The patient engages in conservative therapy including wearing medical grade 1 compression socks, elevating her legs and remaining active with minimal improvement to her symptoms.  The patient's symptoms which consist of pain along her varicosities which worsen towards the end of the day with sitting and standing for long periods of time have progressively worsened to the point that she is unable to function on a daily basis.  Her symptoms have become lifestyle limiting.  The patient underwent a bilateral venous reflux study which was notable for abnormal reflux times in bilateral AASV's.  There is no evidence of deep vein thrombosis or superficial thrombophlebitis in the bilateral lower extremity.  Patient denies any fever, nausea vomiting.  Review of Systems  Constitutional: Negative.   HENT: Negative.   Eyes: Negative.   Respiratory: Negative.   Cardiovascular: Positive for leg swelling.       Bilateral lower extremity painful varicose veins  Gastrointestinal: Negative.   Endocrine: Negative.   Genitourinary: Negative.   Musculoskeletal: Negative.   Skin: Negative.   Allergic/Immunologic: Negative.   Neurological: Negative.   Hematological: Negative.   Psychiatric/Behavioral: Negative.       Objective:   Physical Exam Constitutional: She is oriented to person, place, and time. She appears well-developed and well-nourished. No distress.  HENT:  Head: Normocephalic and atraumatic.  Right Ear: External ear normal.  Left Ear: External ear normal.    Eyes: Pupils are equal, round, and reactive to light. Conjunctivae and EOM are normal.  Neck: Normal range of motion.  Cardiovascular: Normal rate, regular rhythm and normal heart sounds.  Pulses:      Radial pulses are 2+ on the right side, and 2+ on the left side.       Dorsalis pedis pulses are 2+ on the right side, and 2+ on the left side.       Posterior tibial pulses are 2+ on the right side, and 2+ on the left side.  Pulmonary/Chest: Effort normal and breath sounds normal.  Musculoskeletal: Normal range of motion. She exhibits no edema.  Neurological: She is alert and oriented to person, place, and time.  Skin: Skin is warm and dry. She is not diaphoretic.  Patient with diffuse greater than 1 cm and less than 1 cm varicosities noted to the bilateral lower extremity.  There is no stasis dermatitis, skin thickening, cellulitis or ulceration noted to the bilateral lower extremity.  Psychiatric: She has a normal mood and affect. Her behavior is normal. Judgment and thought content normal.  Vitals reviewed.  BP 104/71 (BP Location: Right Arm)   Pulse 70   Resp 16   Ht 5' 2.5" (1.588 m)   Wt 154 lb (69.9 kg)   BMI 27.72 kg/m   Past Medical History:  Diagnosis Date  . Migraines    Social History   Socioeconomic History  . Marital status: Single    Spouse name: Not on file  . Number of children: Not on file  . Years of education: Not on  file  . Highest education level: Not on file  Occupational History  . Not on file  Social Needs  . Financial resource strain: Not on file  . Food insecurity:    Worry: Not on file    Inability: Not on file  . Transportation needs:    Medical: Not on file    Non-medical: Not on file  Tobacco Use  . Smoking status: Never Smoker  . Smokeless tobacco: Never Used  Substance and Sexual Activity  . Alcohol use: No  . Drug use: No  . Sexual activity: Yes    Birth control/protection: None  Lifestyle  . Physical activity:    Days per  week: Not on file    Minutes per session: Not on file  . Stress: Not on file  Relationships  . Social connections:    Talks on phone: Not on file    Gets together: Not on file    Attends religious service: Not on file    Active member of club or organization: Not on file    Attends meetings of clubs or organizations: Not on file    Relationship status: Not on file  . Intimate partner violence:    Fear of current or ex partner: Not on file    Emotionally abused: Not on file    Physically abused: Not on file    Forced sexual activity: Not on file  Other Topics Concern  . Not on file  Social History Narrative  . Not on file   Past Surgical History:  Procedure Laterality Date  . LASER ABLATION     Family History  Problem Relation Age of Onset  . Varicose Veins Mother   . Hypertension Mother   . Hyperlipidemia Father   . Varicose Veins Maternal Grandmother   . Cancer Maternal Grandmother    No Known Allergies     Assessment & Plan:  She presents to review vascular studies.  The patient was last seen on Jun 29, 2017 for evaluation of painful varicose veins to the bilateral lower extremity.  The patient does have a past medical history of undergoing bilateral great saphenous vein ablation.  The patient engages in conservative therapy including wearing medical grade 1 compression socks, elevating her legs and remaining active with minimal improvement to her symptoms.  The patient's symptoms which consist of pain along her varicosities which worsen towards the end of the day with sitting and standing for long periods of time have progressively worsened to the point that she is unable to function on a daily basis.  Her symptoms have become lifestyle limiting.  The patient underwent a bilateral venous reflux study which was notable for abnormal reflux times in bilateral AASV's.  There is no evidence of deep vein thrombosis or superficial thrombophlebitis in the bilateral lower extremity.   Patient denies any fever, nausea vomiting.  1. Chronic venous insufficiency - New Patient with reflux noted to bilateral anterior accessory great saphenous veins. The patient has been engaging in conservative therapy since 2016 when she underwent laser ablation of the great saphenous veins however recently she has started to experience progressively worsening pain along her varicosities. The patient's discomfort has progressed to the point that she is unable to function on a daily basis and her symptoms have become lifestyle limiting. The patient will greatly benefit from foam sclerotherapy to the anterior accessory great saphenous veins with reflux bilaterally. I will applied to the patient's insurance The patient is to continue engaging conservative therapy  until we are asked to receive insurance approval  2. Varicose veins of bilateral lower extremities with pain - Stable As above  Current Outpatient Medications on File Prior to Visit  Medication Sig Dispense Refill  . fexofenadine (ALLEGRA) 60 MG tablet Take 60 mg by mouth as needed for allergies or rhinitis.    Marland Kitchen ibuprofen (ADVIL,MOTRIN) 800 MG tablet Take by mouth.    . diphenhydrAMINE (BENADRYL) 25 mg capsule Take 1 capsule (25 mg total) by mouth every 4 (four) hours as needed. 30 capsule 2  . ketorolac (TORADOL) 10 MG tablet Take 1 tablet (10 mg total) by mouth every 6 (six) hours as needed. (Patient not taking: Reported on 06/29/2017) 20 tablet 0  . metoCLOPramide (REGLAN) 10 MG tablet Take 1 tablet (10 mg total) by mouth 3 (three) times daily with meals. 90 tablet 1  . ondansetron (ZOFRAN) 4 MG tablet Take 1 tablet (4 mg total) by mouth daily as needed for nausea or vomiting. (Patient not taking: Reported on 06/29/2017) 10 tablet 0   No current facility-administered medications on file prior to visit.    There are no Patient Instructions on file for this visit. No follow-ups on file.  Nakai Pollio A Chevy Sweigert, PA-C

## 2017-09-10 ENCOUNTER — Ambulatory Visit (INDEPENDENT_AMBULATORY_CARE_PROVIDER_SITE_OTHER): Payer: Commercial Managed Care - PPO | Admitting: Vascular Surgery

## 2017-09-10 ENCOUNTER — Encounter (INDEPENDENT_AMBULATORY_CARE_PROVIDER_SITE_OTHER): Payer: Self-pay | Admitting: Vascular Surgery

## 2017-09-10 VITALS — BP 107/70 | HR 70 | Resp 12 | Ht 62.0 in | Wt 153.0 lb

## 2017-09-10 DIAGNOSIS — I83813 Varicose veins of bilateral lower extremities with pain: Secondary | ICD-10-CM

## 2017-09-10 DIAGNOSIS — I83812 Varicose veins of left lower extremities with pain: Secondary | ICD-10-CM

## 2017-09-10 DIAGNOSIS — I83811 Varicose veins of right lower extremities with pain: Secondary | ICD-10-CM | POA: Diagnosis not present

## 2017-09-10 NOTE — Progress Notes (Signed)
  Anne BellowsMaria Cabrera Bush is a 37 y.o.female who presents with painful varicose veins of the left leg  Past Medical History:  Diagnosis Date  . Migraines     Past Surgical History:  Procedure Laterality Date  . LASER ABLATION      Current Outpatient Medications  Medication Sig Dispense Refill  . cetirizine (ZYRTEC) 10 MG tablet Take by mouth.    . escitalopram (LEXAPRO) 20 MG tablet Take by mouth.    . fluticasone (FLONASE) 50 MCG/ACT nasal spray Place into the nose.    . ibuprofen (ADVIL,MOTRIN) 800 MG tablet Take by mouth.    . diphenhydrAMINE (BENADRYL) 25 mg capsule Take 1 capsule (25 mg total) by mouth every 4 (four) hours as needed. 30 capsule 2  . fexofenadine (ALLEGRA) 60 MG tablet Take 60 mg by mouth as needed for allergies or rhinitis.    Marland Kitchen. ketorolac (TORADOL) 10 MG tablet Take 1 tablet (10 mg total) by mouth every 6 (six) hours as needed. (Patient not taking: Reported on 06/29/2017) 20 tablet 0  . metoCLOPramide (REGLAN) 10 MG tablet Take 1 tablet (10 mg total) by mouth 3 (three) times daily with meals. 90 tablet 1  . ondansetron (ZOFRAN) 4 MG tablet Take 1 tablet (4 mg total) by mouth daily as needed for nausea or vomiting. (Patient not taking: Reported on 06/29/2017) 10 tablet 0   No current facility-administered medications for this visit.     No Known Allergies  Indication: Patient presents with symptomatic varicose veins of the left lower extremity.  Procedure: Foam sclerotherapy was performed on the left lower extremity. Using ultrasound guidance, 5 mL of foam Sotradecol was used to inject the varicosities of the left lower extremity. Compression wraps were placed. The patient tolerated the procedure well.

## 2017-09-13 ENCOUNTER — Encounter (INDEPENDENT_AMBULATORY_CARE_PROVIDER_SITE_OTHER): Payer: Self-pay | Admitting: Vascular Surgery

## 2017-10-15 IMAGING — US US PELVIS COMPLETE
1 series · 14 of 25 positions shown · non-contrast
Comparison: None.

CLINICAL DATA: Lower abdominal and pelvic pain

EXAM:
TRANSABDOMINAL AND TRANSVAGINAL ULTRASOUND OF PELVIS
DOPPLER ULTRASOUND OF OVARIES
TECHNIQUE: Study was performed transabdominally to optimize pelvic field of
view evaluation and transvaginally to optimize internal visceral
architecture evaluation.
Color and duplex Doppler ultrasound was utilized to evaluate blood
flow to the ovaries.

[Series 1: us pelvis complete · 0.25mm/px · 14 of 176 slices shown]
[im 1/176]
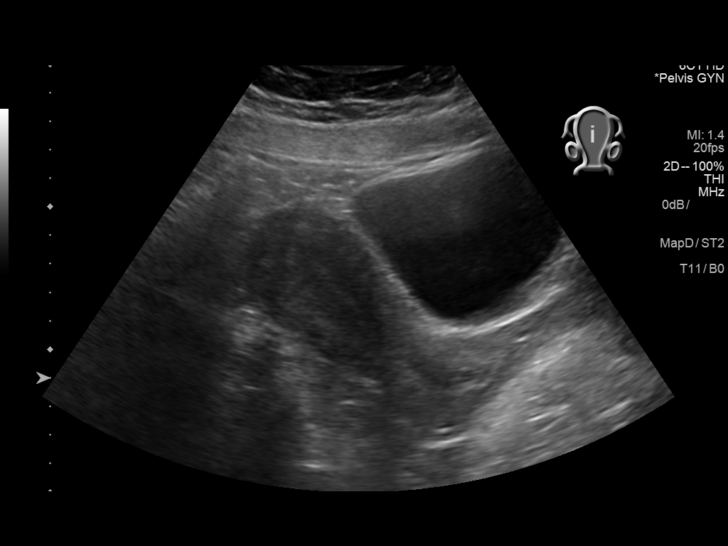
[im 15/176]
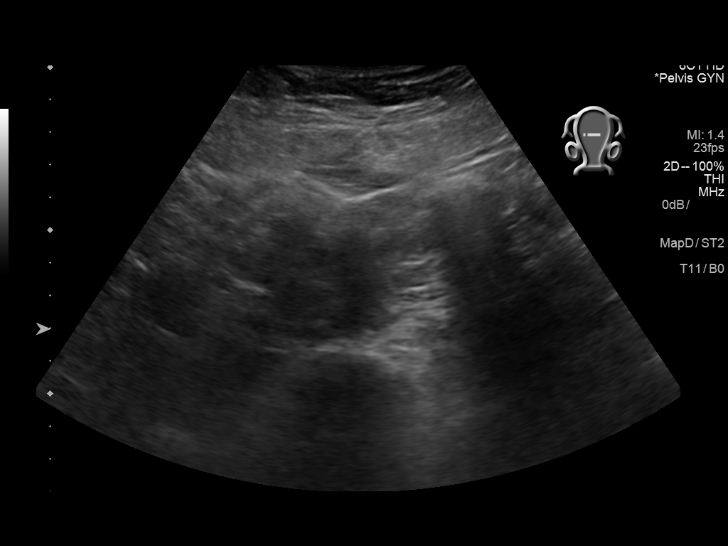
[im 30/176]
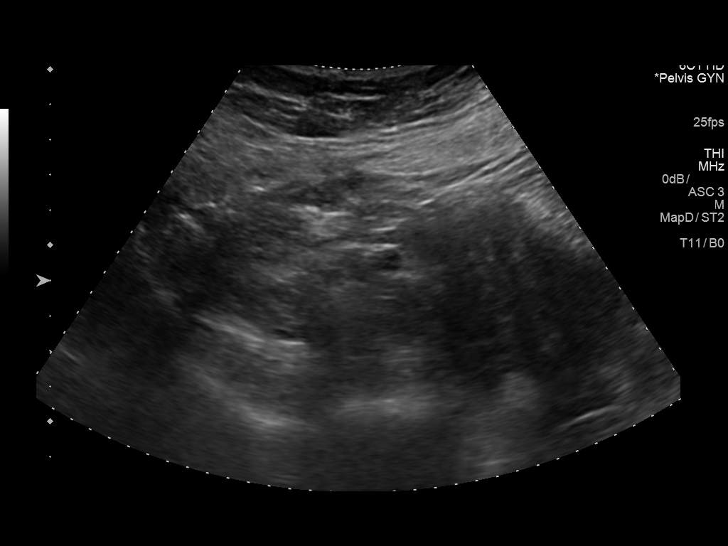
[im 44/176]
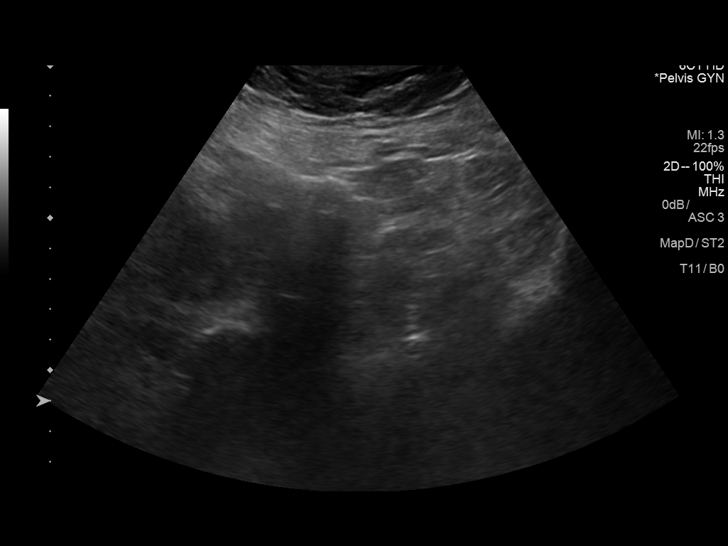
[im 59/176]
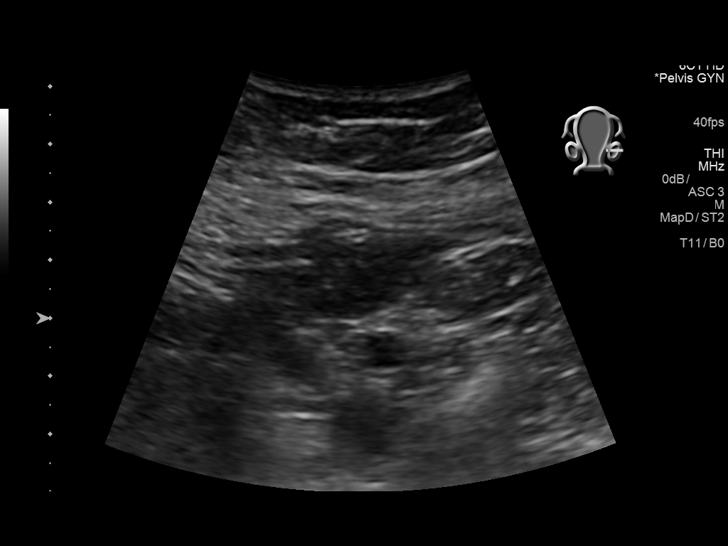
[im 66/176]
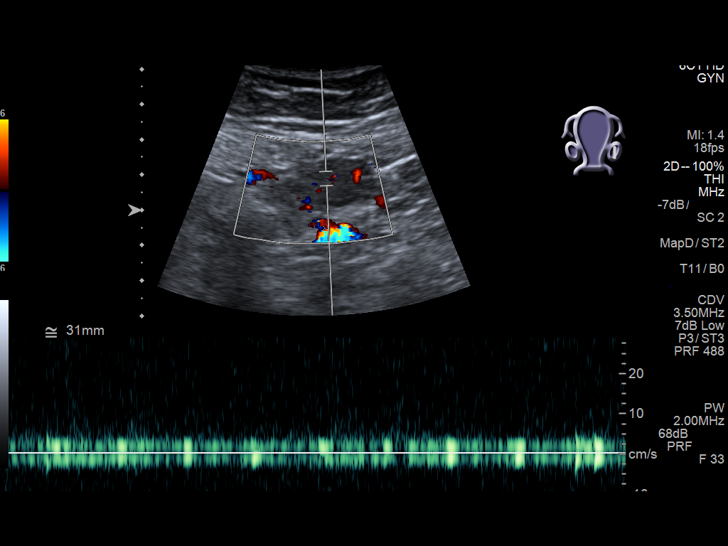
[im 81/176]
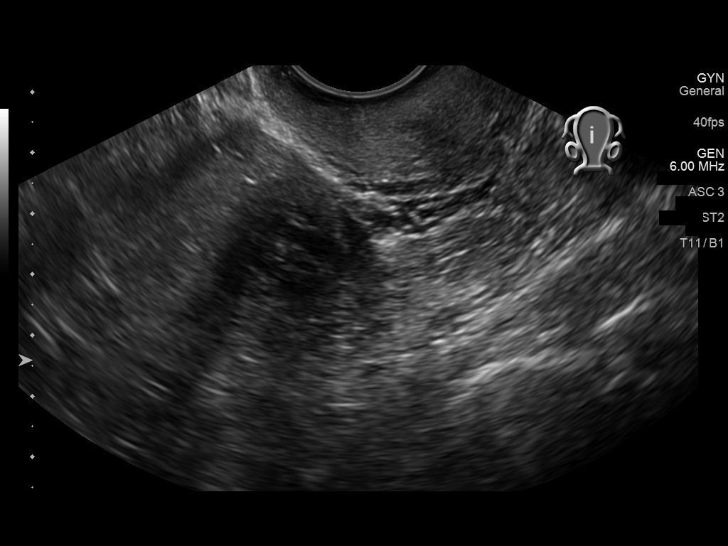
[im 95/176]
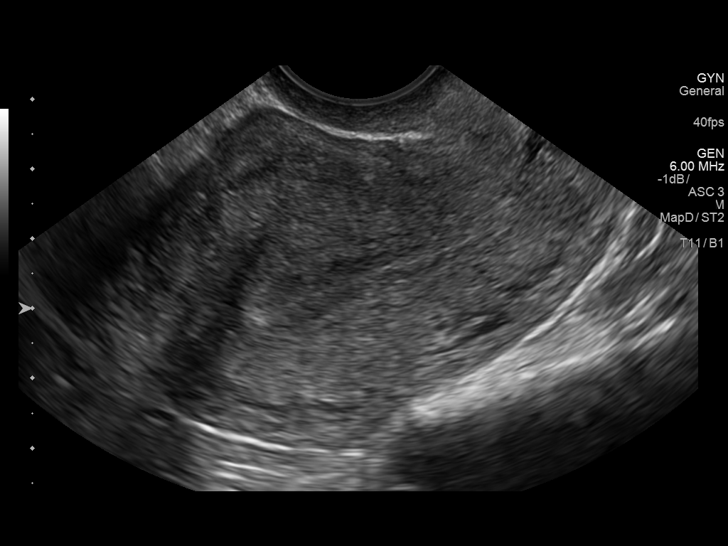
[im 110/176]
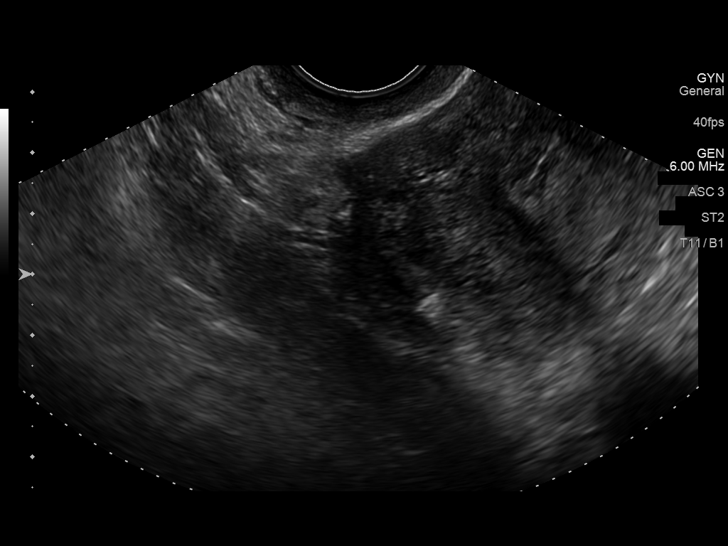
[im 117/176]
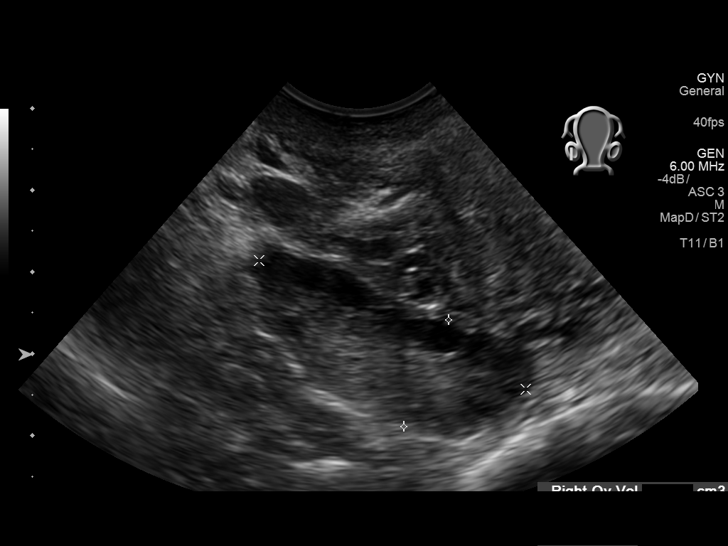
[im 132/176]
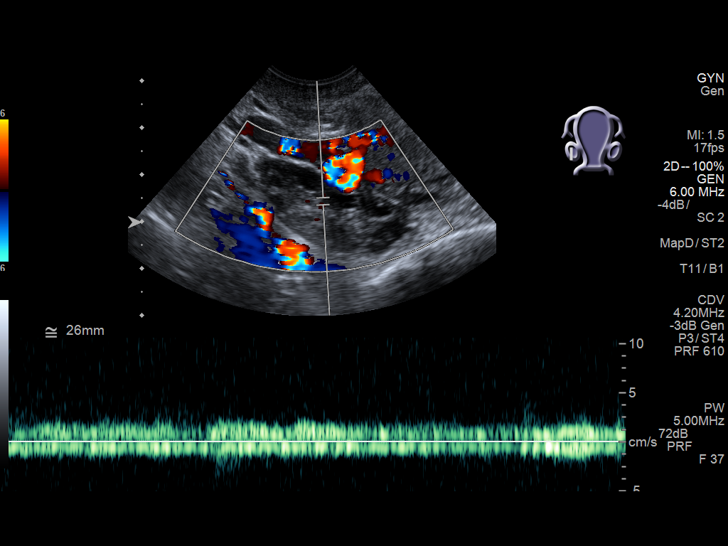
[im 146/176]
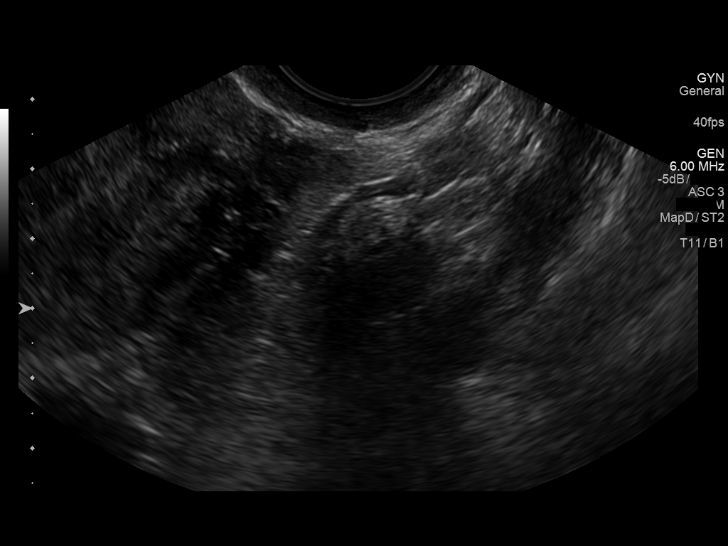
[im 161/176]
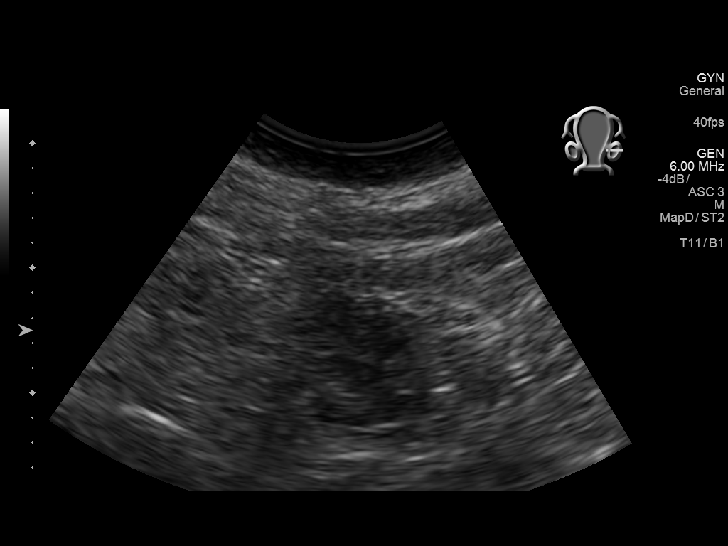
[im 176/176]
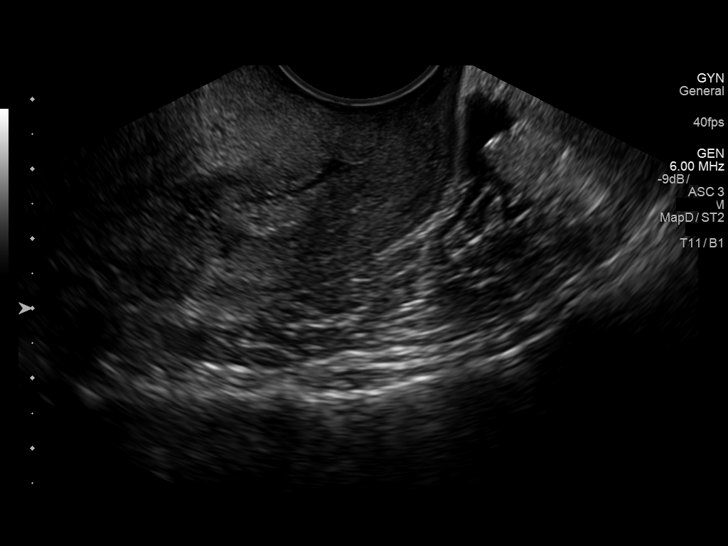

[14 of 25 positions shown; findings below may reference images not displayed]

FINDINGS: Uterus

Measurements: 6.9 x 4.9 x 5.1 cm. No fibroids or other mass
visualized.

Endometrium

Thickness: 10 mm.  No focal abnormality visualized.

Right ovary

Measurements: 3.6 x 1.4 x 2.8 cm. Normal appearance/no adnexal mass.

Left ovary

Measurements: 2.1 x 1.4 x 2.0 cm. Normal appearance/no adnexal mass.

Pulsed Doppler evaluation of both ovaries demonstrates normal
low-resistance arterial and venous waveforms. The peak systolic
velocity in the right ovary is 10 cm/sec. The peak systolic velocity
in the left ovary is 7 cm/sec.

Other findings

No abnormal free fluid.
IMPRESSION: Study within normal limits.

## 2017-11-01 NOTE — Progress Notes (Signed)
  Anne Bush is a 37 y.o.female who presents with painful varicose veins of the left leg  Past Medical History:  Diagnosis Date  . Migraines     Past Surgical History:  Procedure Laterality Date  . LASER ABLATION      Current Outpatient Medications  Medication Sig Dispense Refill  . cetirizine (ZYRTEC) 10 MG tablet Take by mouth.    . diphenhydrAMINE (BENADRYL) 25 mg capsule Take 1 capsule (25 mg total) by mouth every 4 (four) hours as needed. 30 capsule 2  . escitalopram (LEXAPRO) 20 MG tablet Take by mouth.    . fexofenadine (ALLEGRA) 60 MG tablet Take 60 mg by mouth as needed for allergies or rhinitis.    . fluticasone (FLONASE) 50 MCG/ACT nasal spray Place into the nose.    . ibuprofen (ADVIL,MOTRIN) 800 MG tablet Take by mouth.    Marland Kitchen ketorolac (TORADOL) 10 MG tablet Take 1 tablet (10 mg total) by mouth every 6 (six) hours as needed. (Patient not taking: Reported on 06/29/2017) 20 tablet 0  . metoCLOPramide (REGLAN) 10 MG tablet Take 1 tablet (10 mg total) by mouth 3 (three) times daily with meals. 90 tablet 1  . ondansetron (ZOFRAN) 4 MG tablet Take 1 tablet (4 mg total) by mouth daily as needed for nausea or vomiting. (Patient not taking: Reported on 06/29/2017) 10 tablet 0   No current facility-administered medications for this visit.     No Known Allergies  Indication: Patient presents with symptomatic varicose veins of the left lower extremity.  Procedure: Foam sclerotherapy was performed on the left lower extremity. Using ultrasound guidance, 5 mL of foam Sotradecol was used to inject the varicosities of the left lower extremity. Compression wraps were placed. The patient tolerated the procedure well.

## 2017-11-02 ENCOUNTER — Encounter (INDEPENDENT_AMBULATORY_CARE_PROVIDER_SITE_OTHER): Payer: Self-pay | Admitting: Vascular Surgery

## 2017-11-02 ENCOUNTER — Ambulatory Visit (INDEPENDENT_AMBULATORY_CARE_PROVIDER_SITE_OTHER): Payer: Commercial Managed Care - PPO | Admitting: Vascular Surgery

## 2017-11-02 VITALS — BP 122/74 | HR 63 | Resp 16 | Ht 62.0 in | Wt 156.0 lb

## 2017-11-02 DIAGNOSIS — I83813 Varicose veins of bilateral lower extremities with pain: Secondary | ICD-10-CM

## 2017-11-02 DIAGNOSIS — I83812 Varicose veins of left lower extremities with pain: Secondary | ICD-10-CM | POA: Diagnosis not present

## 2017-11-04 ENCOUNTER — Encounter (INDEPENDENT_AMBULATORY_CARE_PROVIDER_SITE_OTHER): Payer: Self-pay | Admitting: Vascular Surgery

## 2017-11-11 IMAGING — CT CT ABD-PELV W/ CM
2 of 4 series · 16 of 46 positions shown, 18 images · IV contrast (omnipaque)
Comparison: None.

CLINICAL DATA: 34-year-old female with increasing right abdomen and
right lower quadrant abdominal pain since yesterday. Malaise.
Initial encounter.

EXAM:
CT ABDOMEN AND PELVIS WITH CONTRAST
TECHNIQUE: Multidetector CT imaging of the abdomen and pelvis was performed
using the standard protocol following bolus administration of
intravenous contrast.
CONTRAST:  100mL OMNIPAQUE IOHEXOL 300 MG/ML  SOLN

[Series 2: routine abd pel with · axial · 0.72mm/px · z∈[-884,-479]mm · 13 of 89 slices shown, 15 images]
[im 4/89  soft-tissue]
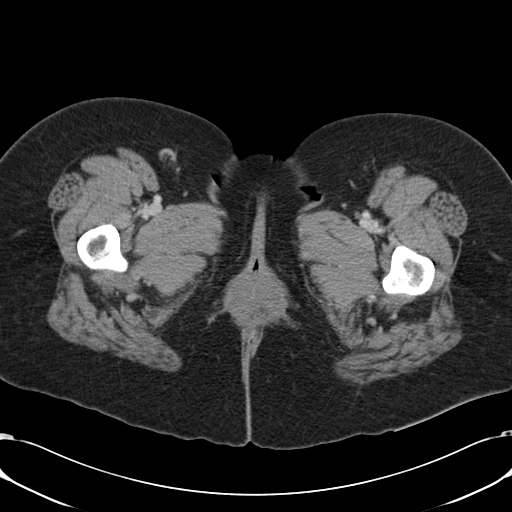
[im 4/89  bone]
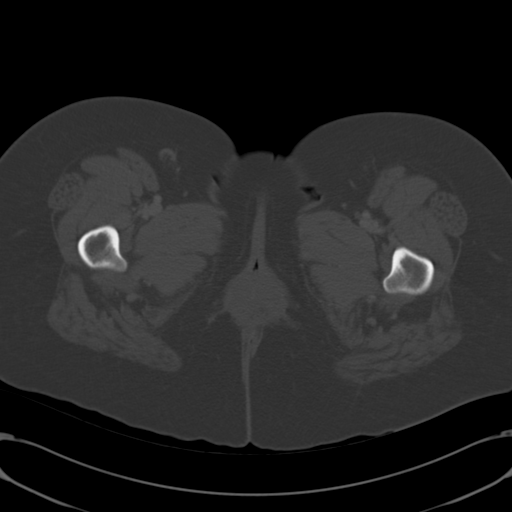
[im 11/89  soft-tissue]
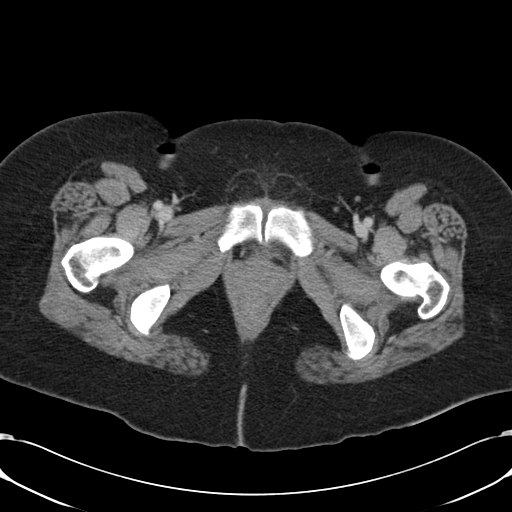
[im 17/89  soft-tissue]
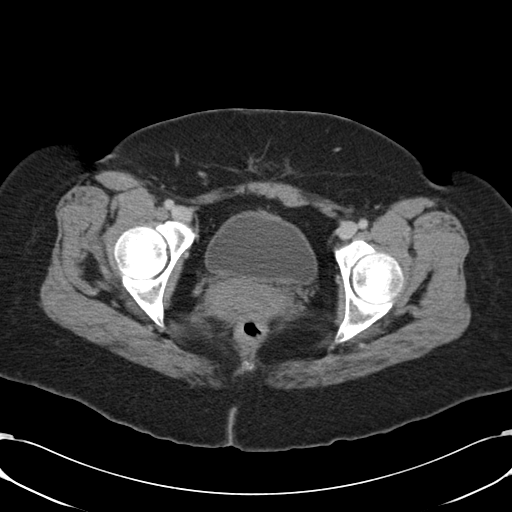
[im 24/89  soft-tissue]
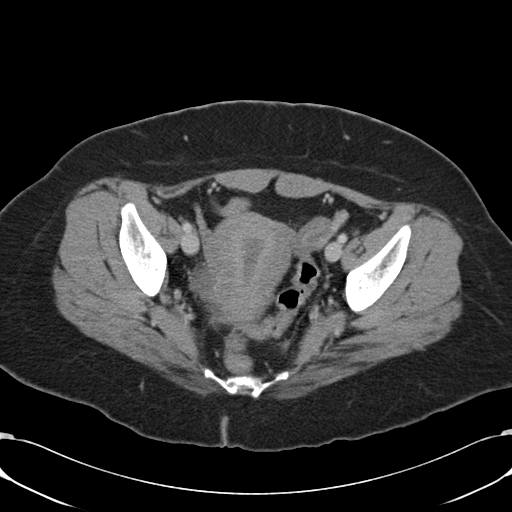
[im 31/89  soft-tissue]
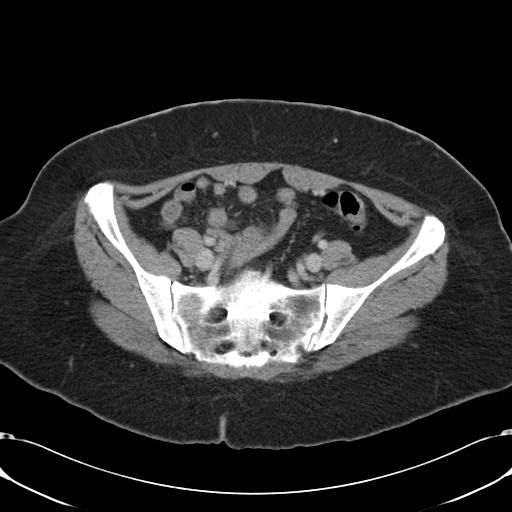
[im 38/89  soft-tissue]
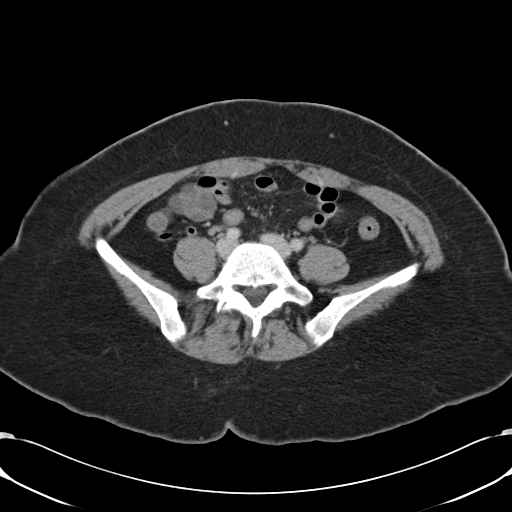
[im 45/89  soft-tissue]
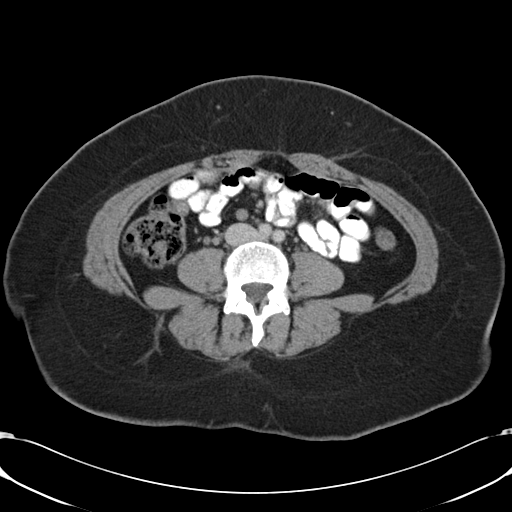
[im 51/89  soft-tissue]
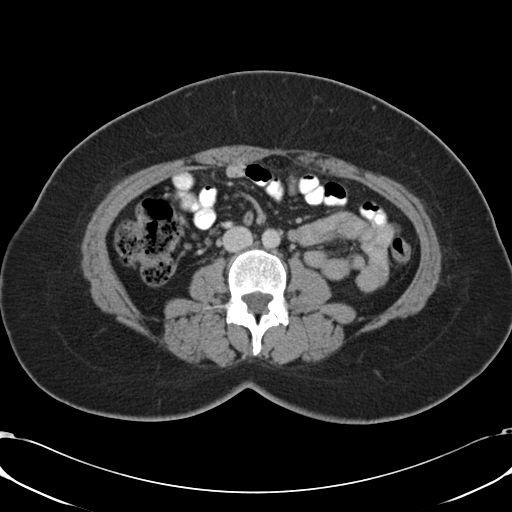
[im 58/89  soft-tissue]
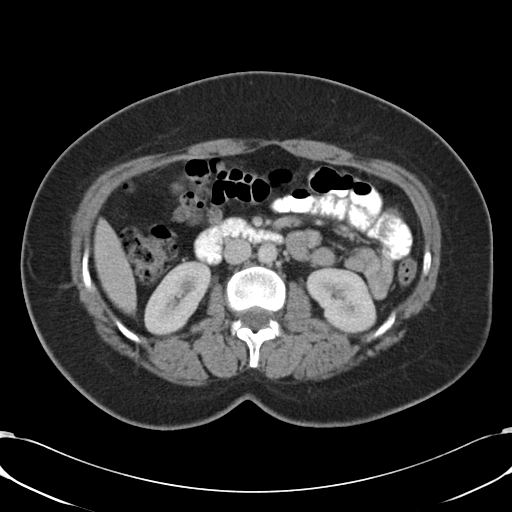
[im 58/89  bone]
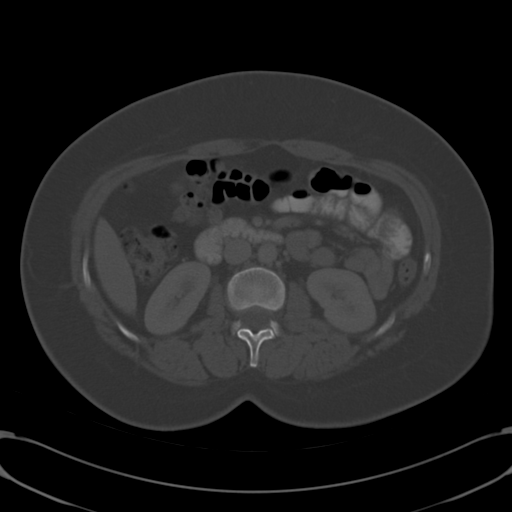
[im 65/89  soft-tissue]
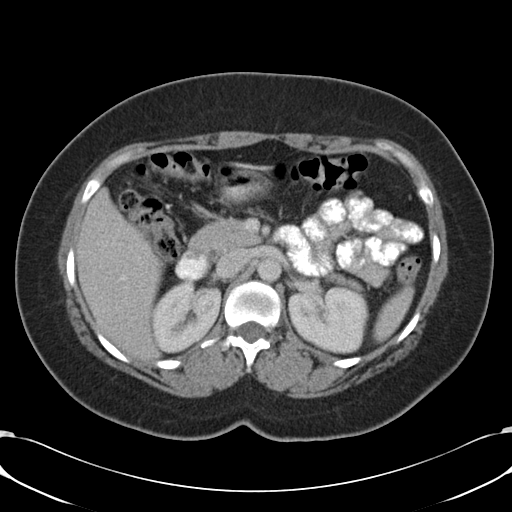
[im 72/89  soft-tissue]
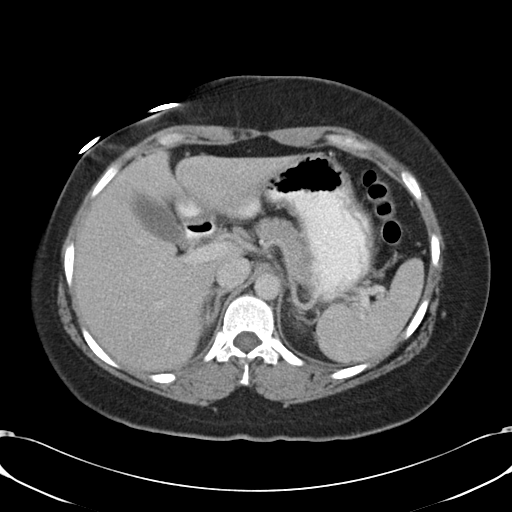
[im 78/89  soft-tissue]
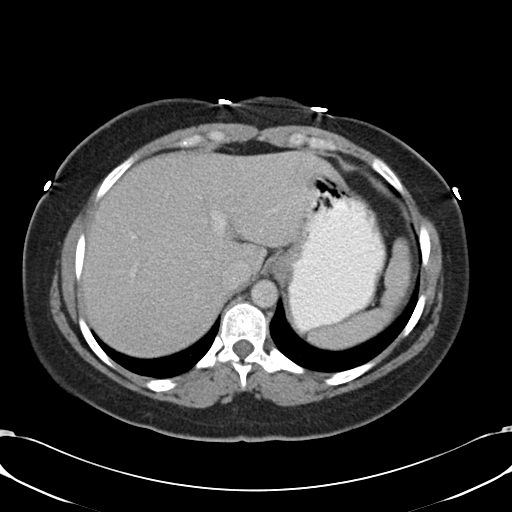
[im 85/89  soft-tissue]
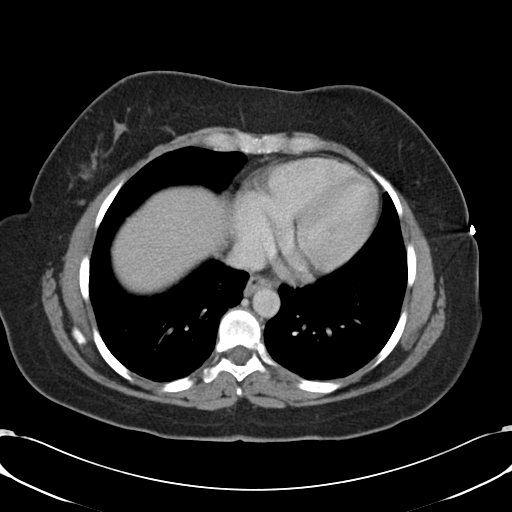

[Series 5: cor routine abd pel with · coronal · 0.67mm/px · 3 of 124 slices shown]
[im 42/124  soft-tissue]
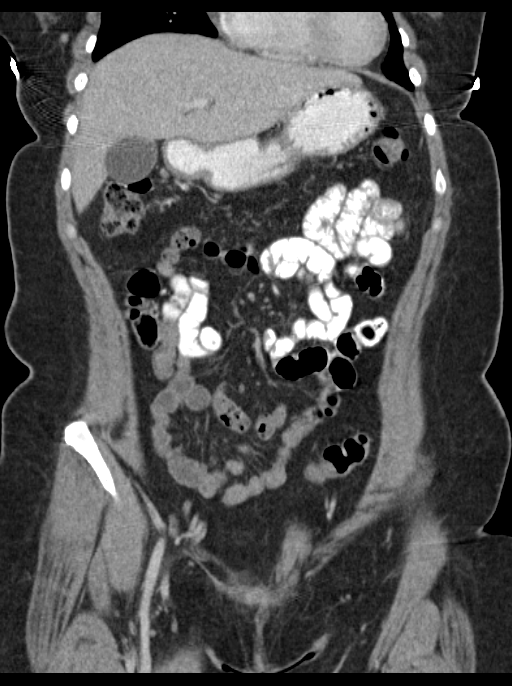
[im 55/124  soft-tissue]
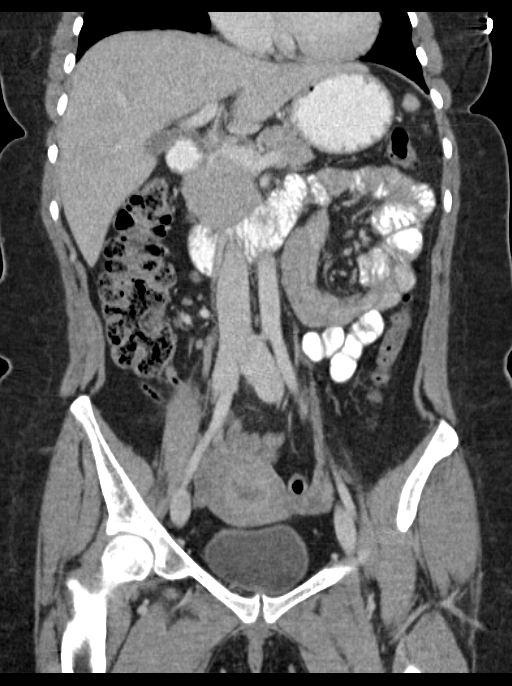
[im 69/124  soft-tissue]
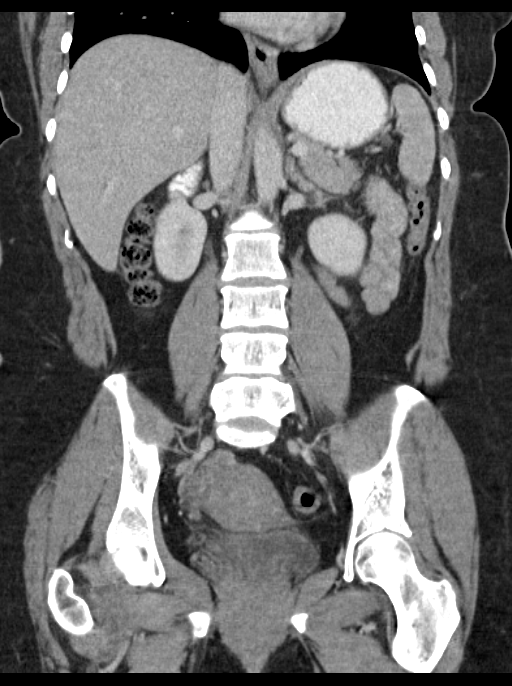

[16 of 46 positions shown; findings below may reference images not displayed]

FINDINGS: Negative lung bases.

No osseous abnormality identified.

No pelvic free fluid. Negative uterus and adnexa. Diminutive urinary
bladder. Decompressed rectum and sigmoid colon.

Negative left colon and transverse colon. Negative right colon.
Normal appendix (series 2, image 53). Normal terminal ileum. Oral
contrast has not yet reached the distal small bowel. No dilated or
inflamed small bowel loops identified. Negative stomach and
duodenum.

No abdominal free air or free fluid. Liver, gallbladder, spleen,
pancreas, and adrenal glands are within normal limits. Portal venous
system appears patent. Major arterial structures are patent. Both
kidneys appear normal. No lymphadenopathy. No mesenteric stranding
identified.
IMPRESSION: Normal appendix.  Normal CT Abdomen and Pelvis.

## 2017-11-30 ENCOUNTER — Ambulatory Visit (INDEPENDENT_AMBULATORY_CARE_PROVIDER_SITE_OTHER): Payer: Commercial Managed Care - PPO | Admitting: Vascular Surgery

## 2021-02-04 ENCOUNTER — Emergency Department
Admission: EM | Admit: 2021-02-04 | Discharge: 2021-02-04 | Discharge status: Against Medical Advice | Payer: Commercial Managed Care - PPO

## 2021-02-04 NOTE — ED Notes (Signed)
No answer when called several times from lobby 

## 2021-08-12 ENCOUNTER — Other Ambulatory Visit: Payer: Self-pay | Admitting: Student

## 2021-08-12 DIAGNOSIS — Z1231 Encounter for screening mammogram for malignant neoplasm of breast: Secondary | ICD-10-CM

## 2022-07-10 ENCOUNTER — Ambulatory Visit
Admission: RE | Admit: 2022-07-10 | Discharge: 2022-07-10 | Disposition: A | Discharge status: Home / Self Care | Payer: Medicaid Other | Source: Ambulatory Visit | Attending: Student | Admitting: Student

## 2022-07-10 DIAGNOSIS — Z1231 Encounter for screening mammogram for malignant neoplasm of breast: Secondary | ICD-10-CM | POA: Insufficient documentation

## 2022-07-11 ENCOUNTER — Encounter: Payer: Self-pay | Admitting: Student

## 2022-07-14 ENCOUNTER — Other Ambulatory Visit: Payer: Self-pay | Admitting: Student

## 2022-07-14 DIAGNOSIS — R928 Other abnormal and inconclusive findings on diagnostic imaging of breast: Secondary | ICD-10-CM

## 2022-07-14 DIAGNOSIS — N63 Unspecified lump in unspecified breast: Secondary | ICD-10-CM

## 2022-07-22 ENCOUNTER — Ambulatory Visit
Admission: RE | Admit: 2022-07-22 | Discharge: 2022-07-22 | Disposition: A | Discharge status: Home / Self Care | Payer: Medicaid Other | Source: Ambulatory Visit | Attending: Student | Admitting: Student

## 2022-07-22 DIAGNOSIS — R928 Other abnormal and inconclusive findings on diagnostic imaging of breast: Secondary | ICD-10-CM

## 2022-07-22 DIAGNOSIS — N63 Unspecified lump in unspecified breast: Secondary | ICD-10-CM | POA: Insufficient documentation

## 2022-07-25 ENCOUNTER — Ambulatory Visit
Admission: RE | Admit: 2022-07-25 | Discharge: 2022-07-25 | Disposition: A | Discharge status: Home / Self Care | Payer: Medicaid Other | Source: Ambulatory Visit | Attending: Student | Admitting: Student

## 2022-07-25 DIAGNOSIS — R928 Other abnormal and inconclusive findings on diagnostic imaging of breast: Secondary | ICD-10-CM | POA: Diagnosis present

## 2022-07-25 DIAGNOSIS — N63 Unspecified lump in unspecified breast: Secondary | ICD-10-CM | POA: Insufficient documentation

## 2022-07-28 ENCOUNTER — Other Ambulatory Visit: Payer: Medicaid Other

## 2023-01-06 ENCOUNTER — Other Ambulatory Visit: Payer: Self-pay | Admitting: Student

## 2023-01-08 ENCOUNTER — Other Ambulatory Visit: Payer: Self-pay | Admitting: Student

## 2023-01-08 DIAGNOSIS — R921 Mammographic calcification found on diagnostic imaging of breast: Secondary | ICD-10-CM

## 2023-01-08 DIAGNOSIS — N6489 Other specified disorders of breast: Secondary | ICD-10-CM

## 2023-01-26 ENCOUNTER — Other Ambulatory Visit: Payer: Self-pay

## 2023-01-26 ENCOUNTER — Encounter: Payer: Self-pay | Admitting: Emergency Medicine

## 2023-01-26 ENCOUNTER — Emergency Department
Admission: EM | Admit: 2023-01-26 | Discharge: 2023-01-26 | Disposition: A | Discharge status: Home / Self Care | Payer: Medicaid Other | Attending: Emergency Medicine | Admitting: Emergency Medicine

## 2023-01-26 ENCOUNTER — Emergency Department: Payer: Medicaid Other

## 2023-01-26 DIAGNOSIS — R55 Syncope and collapse: Secondary | ICD-10-CM | POA: Diagnosis not present

## 2023-01-26 DIAGNOSIS — W228XXA Striking against or struck by other objects, initial encounter: Secondary | ICD-10-CM | POA: Insufficient documentation

## 2023-01-26 DIAGNOSIS — Y990 Civilian activity done for income or pay: Secondary | ICD-10-CM | POA: Diagnosis not present

## 2023-01-26 DIAGNOSIS — S0101XA Laceration without foreign body of scalp, initial encounter: Secondary | ICD-10-CM | POA: Insufficient documentation

## 2023-01-26 DIAGNOSIS — E860 Dehydration: Secondary | ICD-10-CM

## 2023-01-26 DIAGNOSIS — Y92002 Bathroom of unspecified non-institutional (private) residence single-family (private) house as the place of occurrence of the external cause: Secondary | ICD-10-CM | POA: Diagnosis not present

## 2023-01-26 DIAGNOSIS — S0990XA Unspecified injury of head, initial encounter: Secondary | ICD-10-CM

## 2023-01-26 LAB — BASIC METABOLIC PANEL
Anion gap: 8 (ref 5–15)
BUN: 16 mg/dL (ref 6–20)
CO2: 25 mmol/L (ref 22–32)
Calcium: 9.4 mg/dL (ref 8.9–10.3)
Chloride: 104 mmol/L (ref 98–111)
Creatinine, Ser: 0.71 mg/dL (ref 0.44–1.00)
GFR, Estimated: >60 mL/min (ref >60)
Glucose, Bld: 106 mg/dL — ABNORMAL HIGH (ref 70–99)
Potassium: 4 mmol/L (ref 3.5–5.1)
Sodium: 137 mmol/L (ref 135–145)

## 2023-01-26 LAB — URINALYSIS, ROUTINE W REFLEX MICROSCOPIC
Bilirubin Urine: NEGATIVE
Glucose, UA: NEGATIVE mg/dL
Hgb urine dipstick: NEGATIVE
Ketones, ur: NEGATIVE mg/dL
Leukocytes,Ua: NEGATIVE
Nitrite: NEGATIVE
Protein, ur: NEGATIVE mg/dL
Specific Gravity, Urine: 1.015 (ref 1.005–1.030)
pH: 6 (ref 5.0–8.0)

## 2023-01-26 LAB — CBC
HCT: 44.6 % (ref 36.0–46.0)
Hemoglobin: 15 g/dL (ref 12.0–15.0)
MCH: 30.1 pg (ref 26.0–34.0)
MCHC: 33.6 g/dL (ref 30.0–36.0)
MCV: 89.4 fL (ref 80.0–100.0)
Platelets: 371 10*3/uL (ref 150–400)
RBC: 4.99 MIL/uL (ref 3.87–5.11)
RDW: 12.4 % (ref 11.5–15.5)
WBC: 9.7 10*3/uL (ref 4.0–10.5)
nRBC: 0 % (ref 0.0–0.2)

## 2023-01-26 LAB — TROPONIN I (HIGH SENSITIVITY): Troponin I (High Sensitivity): 2 ng/L (ref <18)

## 2023-01-26 LAB — POC URINE PREG, ED: Preg Test, Ur: NEGATIVE

## 2023-01-26 MED ORDER — SODIUM CHLORIDE 0.9 % IV SOLN
Freq: Once | INTRAVENOUS | Status: AC
Start: 2023-01-26 — End: 2023-01-26

## 2023-01-26 NOTE — ED Triage Notes (Signed)
Pt here after fainting at work this morning. Pt states her vision was getting blurry and then she passed out, pt believes she hit her head. Pt denies cp or sob. Pt ambulatory to triage.

## 2023-01-26 NOTE — ED Provider Notes (Signed)
[facility] Provider Note    Event Date/Time   First MD Initiated Contact with Patient 01/26/23 1325     (approximate)   History   Loss of Consciousness   HPI  Anne Bush is a 42 y.o. female with no significant past medical history who presents after a syncopal episode.  Patient reports yesterday she is feeling nauseated and fatigued, today she felt better and decided to go to work.  While she was at work she started to feel lightheaded with seeing spots and started to feel nauseated again.  She went to the bathroom felt more lightheaded after trying to vomit and apparently syncopized.  She is not on blood thinners.  No neurodeficits, feels well now, no chest pain or palpitations.     Physical Exam   Triage Vital Signs: ED Triage Vitals  Encounter Vitals Group     BP 01/26/23 1249 (!) 118/93     Systolic BP Percentile --      Diastolic BP Percentile --      Pulse Rate 01/26/23 1249 76     Resp 01/26/23 1249 17     Temp 01/26/23 1249 98.1 F (36.7 C)     Temp src --      SpO2 01/26/23 1249 98 %     Weight 01/26/23 1250 70.8 kg (156 lb 1.4 oz)     Height 01/26/23 1250 1.575 m (5\' 2" )     Head Circumference --      Peak Flow --      Pain Score 01/26/23 1250 6     Pain Loc --      Pain Education --      Exclude from Growth Chart --     Most recent vital signs: Vitals:   01/26/23 1249  BP: (!) 118/93  Pulse: 76  Resp: 17  Temp: 98.1 F (36.7 C)  SpO2: 98%     General: Awake, no distress.  CV:  Good peripheral perfusion.  Regular rate and rhythm, no murmur Resp:  Normal effort.  Abd:  No distention.  Soft, nontender Other:  No calf swelling 1 cm shallow laceration to the scalp, not bleeding, positive hematoma   ED Results / Procedures / Treatments   Labs (all labs ordered are listed, but only abnormal results are displayed) Labs Reviewed  BASIC METABOLIC PANEL - Abnormal; Notable for the following components:      Result  Value   Glucose, Bld 106 (*)    All other components within normal limits  URINALYSIS, ROUTINE W REFLEX MICROSCOPIC - Abnormal; Notable for the following components:   Color, Urine YELLOW (*)    APPearance HAZY (*)    All other components within normal limits  CBC  POC URINE PREG, ED  CBG MONITORING, ED  TROPONIN I (HIGH SENSITIVITY)     EKG  ED ECG REPORT I, Jene Every, the attending physician, personally viewed and interpreted this ECG.  Date: 01/26/2023  Rhythm: normal sinus rhythm QRS Axis: normal Intervals: normal ST/T Wave abnormalities: normal Narrative Interpretation: no evidence of acute ischemia    RADIOLOGY CT head viewed interpret by me, no ICH, pending radiology review    PROCEDURES:  Critical Care performed:   Procedures   MEDICATIONS ORDERED IN ED: Medications  0.9 %  sodium chloride infusion (has no administration in time range)     IMPRESSION / MDM / ASSESSMENT AND PLAN / ED COURSE  I reviewed the triage vital signs and the  nursing notes. Patient's presentation is most consistent with acute presentation with potential threat to life or bodily function.  Patient presents after syncopal episode with head injury.  Differential includes dehydration, vasovagal syncope, viral illness, dehydration or combination of the above.  Are all well-appearing now and in no acute distress, EKG is reassuring, CBC, BMP unremarkable.  Will treat with IV fluids,  CT head obtained given small laceration to the scalp and syncopal episode  CT is reassuring, will reevaluate after fluids        FINAL CLINICAL IMPRESSION(S) / ED DIAGNOSES   Final diagnoses:  Syncope and collapse  Injury of head, initial encounter     Rx / DC Orders   ED Discharge Orders     None        Note:  This document was prepared using Dragon voice recognition software and may include unintentional dictation errors.   Jene Every, MD 01/26/23 [phone]

## 2023-02-06 ENCOUNTER — Inpatient Hospital Stay: Admission: RE | Admit: 2023-02-06 | Payer: Medicaid Other | Source: Ambulatory Visit

## 2023-02-06 ENCOUNTER — Other Ambulatory Visit: Payer: Medicaid Other

## 2023-03-06 ENCOUNTER — Inpatient Hospital Stay: Admission: RE | Admit: 2023-03-06 | Payer: Medicaid Other | Source: Ambulatory Visit

## 2023-03-06 ENCOUNTER — Other Ambulatory Visit: Payer: Medicaid Other

## 2023-03-06 ENCOUNTER — Ambulatory Visit
Admission: RE | Admit: 2023-03-06 | Discharge: 2023-03-06 | Disposition: A | Discharge status: Home / Self Care | Payer: Medicaid Other | Source: Ambulatory Visit | Attending: Student | Admitting: Student

## 2023-03-06 ENCOUNTER — Ambulatory Visit
Admission: RE | Admit: 2023-03-06 | Discharge: 2023-03-06 | Disposition: A | Discharge status: Home / Self Care | Payer: Medicaid Other | Source: Ambulatory Visit | Attending: Student

## 2023-03-06 DIAGNOSIS — R921 Mammographic calcification found on diagnostic imaging of breast: Secondary | ICD-10-CM

## 2023-03-06 DIAGNOSIS — N6489 Other specified disorders of breast: Secondary | ICD-10-CM | POA: Insufficient documentation

## 2023-03-31 ENCOUNTER — Other Ambulatory Visit (INDEPENDENT_AMBULATORY_CARE_PROVIDER_SITE_OTHER): Payer: Self-pay | Admitting: Nurse Practitioner

## 2023-03-31 DIAGNOSIS — I83813 Varicose veins of bilateral lower extremities with pain: Secondary | ICD-10-CM

## 2023-04-01 NOTE — Progress Notes (Deleted)
 MRN : [id number]  Anne Bush is a 43 y.o. (1980/05/31) female who presents with chief complaint of varicose veins hurt.  History of Present Illness:   The patient is seen for evaluation of symptomatic varicose veins. The patient relates burning and stinging which worsened steadily throughout the course of the day, particularly with standing. The patient also notes an aching and throbbing pain over the varicosities, particularly with prolonged dependent positions. The symptoms are significantly improved with elevation.  The patient also notes that during hot weather the symptoms are greatly intensified. The patient states the pain from the varicose veins interferes with work, daily exercise, shopping and household maintenance. At this point, the symptoms are persistent and severe enough that they're having a negative impact on lifestyle and are interfering with daily activities.  There is no history of DVT, PE or superficial thrombophlebitis. There is no history of ulceration or hemorrhage. The patient denies a significant family history of varicose veins.  The patient has not worn graduated compression in the past. At the present time the patient has not been using over-the-counter analgesics. There is no history of prior surgical intervention or sclerotherapy.   No outpatient medications have been marked as taking for the 04/02/23 encounter (Appointment) with Jama, Cordella MATSU, MD.    Past Medical History:  Diagnosis Date   Migraines     Past Surgical History:  Procedure Laterality Date   LASER ABLATION      Social History Social History   Tobacco Use   Smoking status: Never   Smokeless tobacco: Never  Substance Use Topics   Alcohol use: No   Drug use: No    Family History Family History  Problem Relation Age of Onset   Varicose Veins Mother    Hypertension Mother    Hyperlipidemia Father    Breast cancer Maternal Aunt    Breast cancer Maternal  Grandmother    Varicose Veins Maternal Grandmother    Cancer Maternal Grandmother     No Known Allergies   REVIEW OF SYSTEMS (Negative unless checked)  Constitutional: [] Weight loss  [] Fever  [] Chills Cardiac: [] Chest pain   [] Chest pressure   [] Palpitations   [] Shortness of breath when laying flat   [] Shortness of breath with exertion. Vascular:  [] Pain in legs with walking   [x] Pain in legs with standing  [] History of DVT   [] Phlebitis   [] Swelling in legs   [x] Varicose veins   [] Non-healing ulcers Pulmonary:   [] Uses home oxygen   [] Productive cough   [] Hemoptysis   [] Wheeze  [] COPD   [] Asthma Neurologic:  [] Dizziness   [] Seizures   [] History of stroke   [] History of TIA  [] Aphasia   [] Vissual changes   [] Weakness or numbness in arm   [] Weakness or numbness in leg Musculoskeletal:   [] Joint swelling   [] Joint pain   [] Low back pain Hematologic:  [] Easy bruising  [] Easy bleeding   [] Hypercoagulable state   [] Anemic Gastrointestinal:  [] Diarrhea   [] Vomiting  [] Gastroesophageal reflux/heartburn   [] Difficulty swallowing. Genitourinary:  [] Chronic kidney disease   [] Difficult urination  [] Frequent urination   [] Blood in urine Skin:  [] Rashes   [] Ulcers  Psychological:  [] History of anxiety   []  History of major depression.  Physical Examination  There were no vitals filed for this visit. There is no height or weight on file to calculate BMI. Gen: WD/WN, NAD Head: [place]/AT, No temporalis wasting.  Ear/Nose/Throat: Hearing grossly intact, nares w/o erythema or drainage, pinna without lesions Eyes: PER, EOMI, sclera nonicteric.  Neck: Supple, no gross masses.  No JVD.  Pulmonary:  Good air movement, no audible wheezing, no use of accessory muscles.  Cardiac: RRR, precordium not hyperdynamic. Vascular:  Large varicosities present, greater than 10 mm ***.  Veins are tender to palpation  Mild venous stasis changes to the legs bilaterally.  Trace soft pitting edema CEAP C3sEpAsPr Vessel Right  Left  Radial Palpable Palpable  Gastrointestinal: soft, non-distended. No guarding/no peritoneal signs.  Musculoskeletal: M/S 5/5 throughout.  No deformity.  Neurologic: CN 2-12 intact. Pain and light touch intact in extremities.  Symmetrical.  Speech is fluent. Motor exam as listed above. Psychiatric: Judgment intact, Mood & affect appropriate for pt's clinical situation. Dermatologic: Venous rashes no ulcers noted.  No changes consistent with cellulitis. Lymph : No lichenification or skin changes of chronic lymphedema.  CBC Lab Results  Component Value Date   WBC 9.7 01/26/2023   HGB 15.0 01/26/2023   HCT 44.6 01/26/2023   MCV 89.4 01/26/2023   PLT 371 01/26/2023    BMET    Component Value Date/Time   NA 137 01/26/2023 1252   K 4.0 01/26/2023 1252   CL 104 01/26/2023 1252   CO2 25 01/26/2023 1252   GLUCOSE 106 (H) 01/26/2023 1252   BUN 16 01/26/2023 1252   CREATININE 0.71 01/26/2023 1252   CALCIUM 9.4 01/26/2023 1252   GFRNONAA >60 01/26/2023 1252   GFRAA >60 03/09/2015 1138   CrCl cannot be calculated (Patient's most recent lab result is older than the maximum 21 days allowed.).  COAG No results found for: INR, PROTIME  Radiology MM 3D DIAGNOSTIC MAMMOGRAM UNILATERAL LEFT BREAST Result Date: 03/06/2023 CLINICAL DATA:  BI-RADS 3 follow-up of LEFT breast asymmetries and calcifications, initiated May 2024 EXAM: DIGITAL DIAGNOSTIC UNILATERAL LEFT MAMMOGRAM WITH TOMOSYNTHESIS AND CAD TECHNIQUE: Left digital diagnostic mammography and breast tomosynthesis was performed. The images were evaluated with computer-aided detection. COMPARISON:  Previous exam(s). ACR Breast Density Category d: The breasts are extremely dense, which lowers the sensitivity of mammography. FINDINGS: Diagnostic images of the LEFT breast demonstrate mammographic stability of 2 focal asymmetries in the LEFT upper outer breast at posterior depth. These are best seen on MLO view. Spot magnification views of  the LEFT breast demonstrate stable mammographic appearance of scattered and occasionally grouped punctate calcifications throughout the LEFT outer breast. No new suspicious findings are noted. IMPRESSION: 1. Stable probably benign LEFT breast asymmetries. Recommend follow-up diagnostic mammogram in 6 months. This will establish 1 year of definitive stability from baseline mammogram. 2. Stable probably benign LEFT breast calcifications. Recommend follow-up diagnostic mammogram in 6 months. This will establish 1 year of definitive stability from baseline mammogram. RECOMMENDATION: Recommend bilateral diagnostic mammogram (with RIGHT and LEFT breast ultrasound if deemed necessary) in 6 months. Patient is due for contralateral screening at this point in time. I have discussed the findings and recommendations with the patient. If applicable, a reminder letter will be sent to the patient regarding the next appointment. BI-RADS CATEGORY  3: Probably benign. Electronically Signed   By: Corean Salter M.D.   On: 03/06/2023 14:32     Assessment/Plan There are no diagnoses linked to this encounter.   Cordella Shawl, MD  04/01/2023 7:58 AM

## 2023-04-02 ENCOUNTER — Encounter (INDEPENDENT_AMBULATORY_CARE_PROVIDER_SITE_OTHER): Payer: Commercial Managed Care - PPO

## 2023-04-02 ENCOUNTER — Encounter (INDEPENDENT_AMBULATORY_CARE_PROVIDER_SITE_OTHER): Payer: Commercial Managed Care - PPO | Admitting: Vascular Surgery

## 2023-04-02 DIAGNOSIS — I872 Venous insufficiency (chronic) (peripheral): Secondary | ICD-10-CM

## 2023-04-02 DIAGNOSIS — I83813 Varicose veins of bilateral lower extremities with pain: Secondary | ICD-10-CM

## 2023-04-10 ENCOUNTER — Encounter (INDEPENDENT_AMBULATORY_CARE_PROVIDER_SITE_OTHER): Payer: Self-pay

## 2023-06-17 ENCOUNTER — Encounter: Payer: Self-pay | Admitting: Student

## 2023-06-18 ENCOUNTER — Other Ambulatory Visit: Payer: Self-pay | Admitting: Nurse Practitioner

## 2023-06-18 DIAGNOSIS — N6489 Other specified disorders of breast: Secondary | ICD-10-CM

## 2023-06-18 DIAGNOSIS — R921 Mammographic calcification found on diagnostic imaging of breast: Secondary | ICD-10-CM

## 2023-07-15 ENCOUNTER — Other Ambulatory Visit: Payer: Self-pay

## 2023-07-15 ENCOUNTER — Emergency Department: Admitting: General Practice

## 2023-07-15 ENCOUNTER — Encounter: Admission: EM | Disposition: A | Discharge status: Home / Self Care | Payer: Self-pay | Source: Home / Self Care

## 2023-07-15 ENCOUNTER — Ambulatory Visit
Admission: EM | Admit: 2023-07-15 | Discharge: 2023-07-16 | Disposition: A | Discharge status: Home / Self Care | Attending: Surgery | Admitting: Surgery

## 2023-07-15 ENCOUNTER — Emergency Department

## 2023-07-15 ENCOUNTER — Inpatient Hospital Stay: Admission: RE | Admit: 2023-07-15 | Source: Ambulatory Visit

## 2023-07-15 ENCOUNTER — Other Ambulatory Visit

## 2023-07-15 DIAGNOSIS — K8000 Calculus of gallbladder with acute cholecystitis without obstruction: Secondary | ICD-10-CM | POA: Diagnosis present

## 2023-07-15 DIAGNOSIS — K8012 Calculus of gallbladder with acute and chronic cholecystitis without obstruction: Secondary | ICD-10-CM | POA: Diagnosis not present

## 2023-07-15 DIAGNOSIS — K810 Acute cholecystitis: Secondary | ICD-10-CM | POA: Insufficient documentation

## 2023-07-15 LAB — CBC
HCT: 40.3 % (ref 36.0–46.0)
Hemoglobin: 13.4 g/dL (ref 12.0–15.0)
MCH: 30 pg (ref 26.0–34.0)
MCHC: 33.3 g/dL (ref 30.0–36.0)
MCV: 90.4 fL (ref 80.0–100.0)
Platelets: 342 10*3/uL (ref 150–400)
RBC: 4.46 MIL/uL (ref 3.87–5.11)
RDW: 13.2 % (ref 11.5–15.5)
WBC: 9.3 10*3/uL (ref 4.0–10.5)
nRBC: 0 % (ref 0.0–0.2)

## 2023-07-15 LAB — COMPREHENSIVE METABOLIC PANEL WITH GFR
ALT: 29 U/L (ref 0–44)
AST: 32 U/L (ref 15–41)
Albumin: 3.9 g/dL (ref 3.5–5.0)
Alkaline Phosphatase: 91 U/L (ref 38–126)
Anion gap: 10 (ref 5–15)
BUN: 10 mg/dL (ref 6–20)
CO2: 24 mmol/L (ref 22–32)
Calcium: 9 mg/dL (ref 8.9–10.3)
Chloride: 103 mmol/L (ref 98–111)
Creatinine, Ser: 0.63 mg/dL (ref 0.44–1.00)
GFR, Estimated: >60 mL/min (ref >60)
Glucose, Bld: 114 mg/dL — ABNORMAL HIGH (ref 70–99)
Potassium: 4 mmol/L (ref 3.5–5.1)
Sodium: 137 mmol/L (ref 135–145)
Total Bilirubin: 1 mg/dL (ref 0.0–1.2)
Total Protein: 7.8 g/dL (ref 6.5–8.1)

## 2023-07-15 LAB — URINALYSIS, ROUTINE W REFLEX MICROSCOPIC
Bilirubin Urine: NEGATIVE
Glucose, UA: NEGATIVE mg/dL
Hgb urine dipstick: NEGATIVE
Ketones, ur: NEGATIVE mg/dL
Leukocytes,Ua: NEGATIVE
Nitrite: NEGATIVE
Protein, ur: NEGATIVE mg/dL
Specific Gravity, Urine: 1.017 (ref 1.005–1.030)
pH: 6 (ref 5.0–8.0)

## 2023-07-15 LAB — POC URINE PREG, ED: Preg Test, Ur: NEGATIVE

## 2023-07-15 LAB — LIPASE, BLOOD: Lipase: 39 U/L (ref 11–51)

## 2023-07-15 SURGERY — CHOLECYSTECTOMY, ROBOT-ASSISTED, LAPAROSCOPIC
Anesthesia: General

## 2023-07-15 MED ORDER — MIDAZOLAM HCL 2 MG/2ML IJ SOLN
INTRAMUSCULAR | Status: AC
  Filled 2023-07-15: qty 2

## 2023-07-15 MED ORDER — FENTANYL CITRATE (PF) 100 MCG/2ML IJ SOLN
INTRAMUSCULAR | Status: DC | PRN
  Administered 2023-07-15: 50 ug via INTRAVENOUS
  Administered 2023-07-15: 100 ug via INTRAVENOUS

## 2023-07-15 MED ORDER — FENTANYL CITRATE (PF) 100 MCG/2ML IJ SOLN
INTRAMUSCULAR | Status: AC
  Filled 2023-07-15: qty 2

## 2023-07-15 MED ORDER — INDOCYANINE GREEN 25 MG IV SOLR
1.2500 mg | Freq: Once | INTRAVENOUS | Status: AC
  Administered 2023-07-15: 1.25 mg via INTRAVENOUS

## 2023-07-15 MED ORDER — PHENYLEPHRINE 80 MCG/ML (10ML) SYRINGE FOR IV PUSH (FOR BLOOD PRESSURE SUPPORT)
PREFILLED_SYRINGE | INTRAVENOUS | Status: AC
  Filled 2023-07-15: qty 20

## 2023-07-15 MED ORDER — ACETAMINOPHEN 10 MG/ML IV SOLN
INTRAVENOUS | Status: AC
  Filled 2023-07-15: qty 100

## 2023-07-15 MED ORDER — SODIUM CHLORIDE 0.9 % IV SOLN: INTRAVENOUS | Status: AC

## 2023-07-15 MED ORDER — PHENYLEPHRINE 80 MCG/ML (10ML) SYRINGE FOR IV PUSH (FOR BLOOD PRESSURE SUPPORT)
PREFILLED_SYRINGE | INTRAVENOUS | Status: DC | PRN
  Administered 2023-07-15: 160 ug via INTRAVENOUS
  Administered 2023-07-15 (×2): 80 ug via INTRAVENOUS

## 2023-07-15 MED ORDER — BUPIVACAINE-EPINEPHRINE (PF) 0.25% -1:200000 IJ SOLN
INTRAMUSCULAR | Status: DC | PRN
  Administered 2023-07-15: 30 mL

## 2023-07-15 MED ORDER — PROPOFOL 10 MG/ML IV BOLUS
INTRAVENOUS | Status: DC | PRN
  Administered 2023-07-15: 180 mg via INTRAVENOUS

## 2023-07-15 MED ORDER — PROPOFOL 10 MG/ML IV BOLUS
INTRAVENOUS | Status: AC
  Filled 2023-07-15: qty 20

## 2023-07-15 MED ORDER — ACETAMINOPHEN 10 MG/ML IV SOLN
INTRAVENOUS | Status: DC | PRN
  Administered 2023-07-15: 1000 mg via INTRAVENOUS

## 2023-07-15 MED ORDER — ACETAMINOPHEN 325 MG PO TABS: 650.0000 mg | ORAL_TABLET | Freq: Four times a day (QID) | ORAL | Status: DC | PRN

## 2023-07-15 MED ORDER — KETOROLAC TROMETHAMINE 30 MG/ML IJ SOLN
15.0000 mg | Freq: Once | INTRAMUSCULAR | Status: AC
  Administered 2023-07-15: 15 mg via INTRAMUSCULAR
  Filled 2023-07-15: qty 1

## 2023-07-15 MED ORDER — HYDROCODONE-ACETAMINOPHEN 5-325 MG PO TABS: 1.0000 | ORAL_TABLET | Freq: Four times a day (QID) | ORAL | 0 refills | Status: DC | PRN

## 2023-07-15 MED ORDER — MORPHINE SULFATE (PF) 2 MG/ML IV SOLN: 2.0000 mg | INTRAVENOUS | Status: DC | PRN

## 2023-07-15 MED ORDER — CEFAZOLIN SODIUM-DEXTROSE 2-4 GM/100ML-% IV SOLN
2.0000 g | INTRAVENOUS | Status: AC
  Administered 2023-07-15: 2 g via INTRAVENOUS

## 2023-07-15 MED ORDER — EPHEDRINE 5 MG/ML INJ
INTRAVENOUS | Status: AC
  Filled 2023-07-15: qty 5

## 2023-07-15 MED ORDER — ONDANSETRON HCL 4 MG/2ML IJ SOLN
INTRAMUSCULAR | Status: DC | PRN
  Administered 2023-07-15: 4 mg via INTRAVENOUS

## 2023-07-15 MED ORDER — BUPIVACAINE LIPOSOME 1.3 % IJ SUSP
INTRAMUSCULAR | Status: AC
Start: 2023-07-15 — End: ?
  Filled 2023-07-15: qty 20

## 2023-07-15 MED ORDER — ONDANSETRON HCL 4 MG/2ML IJ SOLN
INTRAMUSCULAR | Status: AC
  Filled 2023-07-15: qty 2

## 2023-07-15 MED ORDER — DEXAMETHASONE SODIUM PHOSPHATE 10 MG/ML IJ SOLN
INTRAMUSCULAR | Status: DC | PRN
  Administered 2023-07-15: 10 mg via INTRAVENOUS

## 2023-07-15 MED ORDER — DEXAMETHASONE SODIUM PHOSPHATE 10 MG/ML IJ SOLN
INTRAMUSCULAR | Status: AC
  Filled 2023-07-15: qty 1

## 2023-07-15 MED ORDER — LIDOCAINE HCL (PF) 2 % IJ SOLN
INTRAMUSCULAR | Status: AC
  Filled 2023-07-15: qty 5

## 2023-07-15 MED ORDER — CEFAZOLIN SODIUM-DEXTROSE 2-4 GM/100ML-% IV SOLN
INTRAVENOUS | Status: AC
  Filled 2023-07-15: qty 100

## 2023-07-15 MED ORDER — ROCURONIUM BROMIDE 10 MG/ML (PF) SYRINGE
PREFILLED_SYRINGE | INTRAVENOUS | Status: AC
  Filled 2023-07-15: qty 10

## 2023-07-15 MED ORDER — FENTANYL CITRATE (PF) 250 MCG/5ML IJ SOLN
INTRAMUSCULAR | Status: AC
  Filled 2023-07-15: qty 5

## 2023-07-15 MED ORDER — OXYCODONE HCL 5 MG/5ML PO SOLN: 5.0000 mg | Freq: Once | ORAL | Status: AC | PRN

## 2023-07-15 MED ORDER — ROCURONIUM BROMIDE 100 MG/10ML IV SOLN
INTRAVENOUS | Status: DC | PRN
  Administered 2023-07-15: 60 mg via INTRAVENOUS

## 2023-07-15 MED ORDER — SUGAMMADEX SODIUM 200 MG/2ML IV SOLN
INTRAVENOUS | Status: DC | PRN
  Administered 2023-07-15: 200 mg via INTRAVENOUS

## 2023-07-15 MED ORDER — MIDAZOLAM HCL 2 MG/2ML IJ SOLN
INTRAMUSCULAR | Status: DC | PRN
  Administered 2023-07-15: 2 mg via INTRAVENOUS

## 2023-07-15 MED ORDER — LIDOCAINE HCL (CARDIAC) PF 100 MG/5ML IV SOSY
PREFILLED_SYRINGE | INTRAVENOUS | Status: DC | PRN
  Administered 2023-07-15: 100 mg via INTRAVENOUS

## 2023-07-15 MED ORDER — DROPERIDOL 2.5 MG/ML IJ SOLN
INTRAMUSCULAR | Status: AC
  Filled 2023-07-15: qty 2

## 2023-07-15 MED ORDER — DROPERIDOL 2.5 MG/ML IJ SOLN
0.6250 mg | Freq: Once | INTRAMUSCULAR | Status: AC | PRN
  Administered 2023-07-15: 0.625 mg via INTRAVENOUS

## 2023-07-15 MED ORDER — OXYCODONE HCL 5 MG PO TABS
ORAL_TABLET | ORAL | Status: AC
  Filled 2023-07-15: qty 1

## 2023-07-15 MED ORDER — FENTANYL CITRATE (PF) 100 MCG/2ML IJ SOLN
25.0000 ug | INTRAMUSCULAR | Status: DC | PRN
  Administered 2023-07-15 (×3): 50 ug via INTRAVENOUS

## 2023-07-15 MED ORDER — OXYCODONE HCL 5 MG PO TABS
5.0000 mg | ORAL_TABLET | Freq: Once | ORAL | Status: AC | PRN
  Administered 2023-07-15: 5 mg via ORAL

## 2023-07-15 MED ORDER — ONDANSETRON HCL 4 MG/2ML IJ SOLN: 4.0000 mg | Freq: Four times a day (QID) | INTRAMUSCULAR | Status: DC | PRN

## 2023-07-15 MED ORDER — BUPIVACAINE-EPINEPHRINE (PF) 0.25% -1:200000 IJ SOLN
INTRAMUSCULAR | Status: AC
  Filled 2023-07-15: qty 30

## 2023-07-15 MED ORDER — 0.9 % SODIUM CHLORIDE (POUR BTL) OPTIME
TOPICAL | Status: DC | PRN
  Administered 2023-07-15: 500 mL

## 2023-07-15 SURGICAL SUPPLY — 38 items
BAG PRESSURE INF REUSE 3000 (BAG) IMPLANT
CLIP LIGATING HEM O LOK PURPLE (MISCELLANEOUS) ×1 IMPLANT
COVER TIP SHEARS 8 DVNC (MISCELLANEOUS) ×1 IMPLANT
DERMABOND ADVANCED .7 DNX12 (GAUZE/BANDAGES/DRESSINGS) ×1 IMPLANT
DRAPE ARM DVNC X/XI (DISPOSABLE) ×4 IMPLANT
DRAPE COLUMN DVNC XI (DISPOSABLE) ×1 IMPLANT
FORCEPS BPLR R/ABLATION 8 DVNC (INSTRUMENTS) ×1 IMPLANT
FORCEPS PROGRASP DVNC XI (FORCEP) ×1 IMPLANT
GLOVE ORTHO TXT STRL SZ7.5 (GLOVE) ×2 IMPLANT
GOWN STRL REUS W/ TWL LRG LVL3 (GOWN DISPOSABLE) ×2 IMPLANT
GOWN STRL REUS W/ TWL XL LVL3 (GOWN DISPOSABLE) ×2 IMPLANT
GRASPER SUT TROCAR 14GX15 (MISCELLANEOUS) ×1 IMPLANT
IRRIGATION STRYKERFLOW (MISCELLANEOUS) IMPLANT
IRRIGATOR SUCT 8 DISP DVNC XI (IRRIGATION / IRRIGATOR) IMPLANT
KIT PINK PAD W/HEAD ARE REST (MISCELLANEOUS) ×1 IMPLANT
KIT PINK PAD W/HEAD ARM REST (MISCELLANEOUS) ×1 IMPLANT
KIT TURNOVER KIT A (KITS) ×1 IMPLANT
LABEL OR SOLS (LABEL) ×1 IMPLANT
MANIFOLD NEPTUNE II (INSTRUMENTS) ×1 IMPLANT
NDL HYPO 22X1.5 SAFETY MO (MISCELLANEOUS) ×1 IMPLANT
NDL INSUFFLATION 14GA 120MM (NEEDLE) ×1 IMPLANT
NEEDLE HYPO 22X1.5 SAFETY MO (MISCELLANEOUS) ×1 IMPLANT
NEEDLE INSUFFLATION 14GA 120MM (NEEDLE) ×1 IMPLANT
NS IRRIG 500ML POUR BTL (IV SOLUTION) ×1 IMPLANT
PACK LAP CHOLECYSTECTOMY (MISCELLANEOUS) ×1 IMPLANT
SCISSORS MNPLR CVD DVNC XI (INSTRUMENTS) ×1 IMPLANT
SEAL UNIV 5-12 XI (MISCELLANEOUS) ×4 IMPLANT
SET TUBE SMOKE EVAC HIGH FLOW (TUBING) ×1 IMPLANT
SOL .9 NS 3000ML IRR UROMATIC (IV SOLUTION) IMPLANT
SOLUTION ELECTROSURG ANTI STCK (MISCELLANEOUS) ×1 IMPLANT
SPIKE FLUID TRANSFER (MISCELLANEOUS) ×1 IMPLANT
SUT VICRYL 0 UR6 27IN ABS (SUTURE) ×1 IMPLANT
SUTURE MNCRL 4-0 27XMF (SUTURE) ×1 IMPLANT
SYRINGE TOOMEY IRRIG 70ML (MISCELLANEOUS) IMPLANT
SYSTEM BAG RETRIEVAL 10MM (BASKET) ×1 IMPLANT
TRAP FLUID SMOKE EVACUATOR (MISCELLANEOUS) ×1 IMPLANT
TROCAR Z-THREAD FIOS 11X100 BL (TROCAR) ×1 IMPLANT
WATER STERILE IRR 500ML POUR (IV SOLUTION) ×1 IMPLANT

## 2023-07-15 NOTE — Anesthesia Procedure Notes (Addendum)
 Procedure Name: Intubation Date/Time: 07/15/2023 1:05 PM  Performed by: Marisue Side, CRNAPre-anesthesia Checklist: Patient identified, Patient being monitored, Timeout performed, Emergency Drugs available and Suction available Patient Re-evaluated:Patient Re-evaluated prior to induction Oxygen Delivery Method: Circle system utilized Preoxygenation: Pre-oxygenation with 100% oxygen Induction Type: IV induction Ventilation: Mask ventilation without difficulty Laryngoscope Size: 3 and McGrath Grade View: Grade I Tube type: Oral Tube size: 7.0 mm Number of attempts: 1 Airway Equipment and Method: Stylet Placement Confirmation: ETT inserted through vocal cords under direct vision, positive ETCO2 and breath sounds checked- equal and bilateral Secured at: 21 cm Tube secured with: Tape Dental Injury: Teeth and Oropharynx as per pre-operative assessment

## 2023-07-15 NOTE — ED Notes (Signed)
 Surgeon @ bedside providing informed consent to Pt.

## 2023-07-15 NOTE — Interval H&P Note (Signed)
 History and Physical Interval Note:  07/15/2023 12:21 PM  Anne Bush  has presented today for surgery, with the diagnosis of acute calculus cholecystitis.  The various methods of treatment have been discussed with the patient and family. After consideration of risks, benefits and other options for treatment, the patient has consented to  Procedure(s): CHOLECYSTECTOMY, ROBOT-ASSISTED, LAPAROSCOPIC (N/A) as a surgical intervention.  The patient's history has been reviewed, patient examined, no change in status, stable for surgery.  I have reviewed the patient's chart and labs.  Questions were answered to the patient's satisfaction.     Flynn Hylan

## 2023-07-15 NOTE — Discharge Instructions (Signed)
 In addition to included general post-operative instructions,  Diet: Resume home diet. Recommend avoiding or limiting fatty/greasy foods over the next few days/week. If you do eat these, you may (or may not) notice diarrhea. This is expected while your body adjusts to not having a gallbladder, and it typically resolves with time.    Activity: No heavy lifting >20 pounds (children, pets, laundry, garbage) or strenuous activity for 2-4 weeks, but light activity and walking are encouraged. Do not drive or drink alcohol if taking narcotic pain medications or having pain that might distract from driving.  Wound care: 2 days after surgery (05/24), you may shower/get incision wet with soapy water and pat dry (do not rub incisions), but no baths or submerging incision underwater until follow-up.   Medications: Resume all home medications. For mild to moderate pain: acetaminophen (Tylenol) or ibuprofen/naproxen (if no kidney disease). Combining Tylenol with alcohol can substantially increase your risk of causing liver disease. Narcotic pain medications, if prescribed, can be used for severe pain, though may cause nausea, constipation, and drowsiness. Do not combine Tylenol and Percocet (or similar) within a 6 hour period as Percocet (and similar) contain(s) Tylenol. If you do not need the narcotic pain medication, you do not need to fill the prescription.  Call office [phone] / [phone]) at any time if any questions, worsening pain, fevers/chills, bleeding, drainage from incision site, or other concerns.

## 2023-07-15 NOTE — ED Notes (Addendum)
 Pt given gown and hospital socks to change into. Pt belonging bag given to Pt. Bag includes Pt label.

## 2023-07-15 NOTE — ED Triage Notes (Signed)
 Pt to ED via POV c/o mid abd pain that started this morning. Paiun radiates to back. Pt endorses some nausea. Denies vomiting, diarrhea, CP, SOB, fevers. Last BM was yesterday. Denies urinary sx.

## 2023-07-15 NOTE — Anesthesia Preprocedure Evaluation (Signed)
 Anesthesia Evaluation  Patient identified by MRN, date of birth, ID band Patient awake    Reviewed: Allergy & Precautions, NPO status , Patient's Chart, lab work & pertinent test results  Airway Mallampati: I  TM Distance: >3 FB Neck ROM: full    Dental  (+) Dental Advidsory Given, Teeth Intact   Pulmonary neg pulmonary ROS   Pulmonary exam normal        Cardiovascular negative cardio ROS Normal cardiovascular exam     Neuro/Psych  PSYCHIATRIC DISORDERS      negative neurological ROS     GI/Hepatic negative GI ROS, Neg liver ROS,,,  Endo/Other  negative endocrine ROS    Renal/GU      Musculoskeletal   Abdominal   Peds  Hematology negative hematology ROS (+)   Anesthesia Other Findings Past Medical History: No date: Migraines  Past Surgical History: No date: LASER ABLATION  BMI    Body Mass Index: 31.25 kg/m      Reproductive/Obstetrics negative OB ROS                             Anesthesia Physical Anesthesia Plan  ASA: 2  Anesthesia Plan: General ETT   Post-op Pain Management:    Induction: Intravenous  PONV Risk Score and Plan: 3 and Ondansetron , Dexamethasone and Midazolam  Airway Management Planned: Oral ETT  Additional Equipment:   Intra-op Plan:   Post-operative Plan: Extubation in OR  Informed Consent: I have reviewed the patients History and Physical, chart, labs and discussed the procedure including the risks, benefits and alternatives for the proposed anesthesia with the patient or authorized representative who has indicated his/her understanding and acceptance.     Dental Advisory Given  Plan Discussed with: Anesthesiologist, CRNA and Surgeon  Anesthesia Plan Comments: (Patient consented for risks of anesthesia including but not limited to:  - adverse reactions to medications - damage to eyes, teeth, lips or other oral mucosa - nerve damage due to  positioning  - sore throat or hoarseness - Damage to heart, brain, nerves, lungs, other parts of body or loss of life  Patient voiced understanding and assent.)       Anesthesia Quick Evaluation

## 2023-07-15 NOTE — Transfer of Care (Signed)
 Immediate Anesthesia Transfer of Care Note  Patient: Anne Bush  Procedure(s) Performed: CHOLECYSTECTOMY, ROBOT-ASSISTED, LAPAROSCOPIC  Patient Location: PACU  Anesthesia Type:General  Level of Consciousness: drowsy and patient cooperative  Airway & Oxygen Therapy: Patient Spontanous Breathing and Patient connected to face mask oxygen  Post-op Assessment: Report given to RN and Post -op Vital signs reviewed and stable  Post vital signs: Reviewed and stable  Last Vitals:  Vitals Value Taken Time  BP 136/73 07/15/23 1419  Temp 36.4 C 07/15/23 1419  Pulse 66 07/15/23 1420  Resp 23 07/15/23 1420  SpO2 100 % 07/15/23 1420  Vitals shown include unfiled device data.  Last Pain:  Vitals:   07/15/23 1419  TempSrc:   PainSc: 0-No pain         Complications: No notable events documented.

## 2023-07-15 NOTE — Op Note (Signed)
 Robotic cholecystectomy with Indocyamine Green Ductal Imaging.   Pre-operative Diagnosis: Acute calculus cholecystitis  Post-operative Diagnosis:  Same.  Procedure: Robotic assisted laparoscopic cholecystectomy with Indocyamine Green Ductal Imaging.   Surgeon: Flynn Hylan, M.D., FACS  Anesthesia: General. with endotracheal tube  Findings: Edematous gallbladder with large stone.  Estimated Blood Loss: 5 mL         Drains: None         Specimens: Gallbladder           Complications: none  Procedure Details  The patient was seen again in the Holding Room.  1.25 mg dose of ICG was administered intravenously.   The benefits, complications, treatment options, risks and expected outcomes were again reviewed with the patient. The likelihood of improving the patient's symptoms with return to their baseline status is good.  The patient and/or family concurred with the proposed plan, giving informed consent, again alternatives reviewed.  The patient was taken to Operating Room, identified, and the procedure verified as robotic assisted laparoscopic cholecystectomy.  Prior to the induction of general anesthesia, antibiotic prophylaxis was administered. VTE prophylaxis was in place. General endotracheal anesthesia was then administered and tolerated well. The patient was positioned in the supine position.  After the induction, the abdomen was prepped with Chloraprep and draped in the sterile fashion.  A Time Out was held and the above information confirmed.  After local infiltration of quarter percent Marcaine with epinephrine, stab incision was made left upper quadrant.  Just below the costal margin at Palmer's point, approximately midclavicular line the Veres needle is passed with sensation of the layers to penetrate the abdominal wall and into the peritoneum.  Saline drop test is confirmed peritoneal placement.  Insufflation is initiated with carbon dioxide to pressures of 15 mmHg.  Local  infiltration with 0.25% Marcaine with epinephrine is utilized for all skin incisions.  Made a 12 mm incision on the right periumbilical site, I advanced an optical 11mm port under direct visualization into the peritoneal cavity.  Once the peritoneum was penetrated, insufflation was initiated.  The trocar was then advanced into the abdominal cavity under direct visualization. Pneumoperitoneum was then continued utilizing CO2 at 15 mmHg or less and tolerated well without any adverse changes in the patient's vital signs.  Two 8.5-mm ports were placed in the left lower quadrant and laterally, and one to the right lower quadrant, all under direct vision. Local infiltration with a mixture of Exparel and 0.25% Marcaine with epinephrine is utilized for all port sites with deep infiltration under visualization.   The patient was positioned  in reverse Trendelenburg, tilted the patient's left side down.  Da Vinci XI robot was then positioned on to the patient's left side, and docked.  The gallbladder was identified, the fundus grasped via the arm 4 Prograsp and retracted cephalad. Adhesions were lysed with scissors and cautery.  The infundibulum was identified grasped and retracted laterally, exposing the peritoneum overlying the triangle of Calot. This was then opened and dissected using cautery & scissors.  We did achieve the critical view of safety with 2 and only 2 structures entering the gallbladder and we were able to see the liver plate posterior to this from both sides;with the extended critical view of the cystic duct and cystic artery obtained, aided by the ICG via FireFly which improved localization of the major ductal anatomy.    The cystic duct was clearly identified and dissected to isolation.   Artery well isolated and clipped, and the cystic  duct was triple clipped and divided with scissors, as close to the gallbladder neck as feasible, thus leaving two on the remaining stump.  The specimen side of the  artery is sealed with bipolar and divided with monopolar scissors.   The gallbladder was taken from the gallbladder fossa in a retrograde fashion with the electrocautery. The gallbladder was removed and placed in an Endocatch bag.  The liver bed is inspected. Hemostasis was confirmed.  The robot was undocked and moved away from the operative field. No irrigation was utilized. The gallbladder and Endocatch sac were then removed through the paraumbilical port site.   Inspection of the right upper quadrant was performed. No bleeding, bile duct injury or leak, or bowel injury was noted. The infra-umbilical port site fascia was closed with interrumpted 0 Vicryl sutures using PMI/cone under direct visualization. Pneumoperitoneum was released and ports removed.  4-0 subcuticular Monocryl was used to close the skin. Dermabond was  applied.  The patient was then extubated and brought to the recovery room in stable condition. Sponge, lap, and needle counts were correct at closure and at the conclusion of the case.               Flynn Hylan, M.D., FACS 07/15/2023 2:10 PM

## 2023-07-15 NOTE — H&P (Signed)
 [facility] SURGICAL HISTORY & PHYSICAL (cpt [phone])  HISTORY OF PRESENT ILLNESS (HPI):  43 y.o. female [facility] for abdominal pain. Patient reports in retrospect, she believe symptoms may have actually start about 1 month ago. This has mainly been vague abdominal and back pain. Things seemed to have progress over the last week. This has become much worse this morning. Significant epigastric pain. Associated nausea. No fever, chills, CP, SOB, urinary changes, bowel changes, emesis, nor juandice. No known history of gallstones but does report mild post prandial pain previously. No previous surgical history. Work up in the ED revealed a normal WBC at 9.3K, Hgb to 13.4, sCr - 0.63, LFTs normal, bilirubin 1.0. RUQ US  showed large stone with gallbladder wall thickening.   General surgery is consulted by emergency medicine physician Dr Iver Marker, MD for evaluation and management of cholecystitis.    PAST MEDICAL HISTORY (PMH):  Past Medical History:  Diagnosis Date   Migraines     Reviewed. Otherwise negative.   PAST SURGICAL HISTORY (PSH):  Past Surgical History:  Procedure Laterality Date   LASER ABLATION      Reviewed. Otherwise negative.   MEDICATIONS:  Prior to Admission medications   Medication Sig Start Date End Date Taking? Authorizing Provider  cetirizine (ZYRTEC) 10 MG tablet Take by mouth. 07/27/17   [provider]  diphenhydrAMINE  (BENADRYL ) 25 mg capsule Take 1 capsule (25 mg total) by mouth every 4 (four) hours as needed. 08/13/14 08/13/15  Triplett, Davene Ernst B, FNP  escitalopram (LEXAPRO) 20 MG tablet Take by mouth. 03/09/17   [provider]  fexofenadine (ALLEGRA) 60 MG tablet Take 60 mg by mouth as needed for allergies or rhinitis.    [provider]  fluticasone (FLONASE) 50 MCG/ACT nasal spray Place into the nose. 07/27/17 07/27/18  [provider]  ibuprofen (ADVIL,MOTRIN) 800 MG tablet Take by mouth. 03/09/17    [provider]  ketorolac  (TORADOL ) 10 MG tablet Take 1 tablet (10 mg total) by mouth every 6 (six) hours as needed. Patient not taking: Reported on 06/29/2017 08/13/14   Chrystie Crass B, FNP  metoCLOPramide  (REGLAN ) 10 MG tablet Take 1 tablet (10 mg total) by mouth 3 (three) times daily with meals. 08/13/14 08/13/15  Triplett, Davene Ernst B, FNP  ondansetron  (ZOFRAN ) 4 MG tablet Take 1 tablet (4 mg total) by mouth daily as needed for nausea or vomiting. Patient not taking: Reported on 06/29/2017 03/09/15   Racheal Buddle, MD     ALLERGIES:  No Known Allergies   SOCIAL HISTORY:  Social History   Socioeconomic History   Marital status: Single    Spouse name: Not on file   Number of children: Not on file   Years of education: Not on file   Highest education level: Not on file  Occupational History   Not on file  Tobacco Use   Smoking status: Never   Smokeless tobacco: Never  Substance and Sexual Activity   Alcohol use: No   Drug use: No   Sexual activity: Yes    Birth control/protection: None  Other Topics Concern   Not on file  Social History Narrative   Not on file   Social Drivers of Health   Financial Resource Strain: Patient Declined (09/19/2022)   Received from [facility]   Overall Financial Resource Strain (CARDIA)    Difficulty of Paying Living Expenses: Patient declined  Food Insecurity: Patient Declined (09/19/2022)   Received from [facility]   Hunger Vital Sign    Worried About Running Out of Food in the Last Year: Patient declined    Ran Out of Food in the Last Year: Patient declined  Transportation Needs: Patient Declined (09/19/2022)   Received from [facility] - Transportation    In the past 12 months, has lack of transportation kept you from medical appointments or from getting medications?: Patient declined    Lack of Transportation (Non-Medical): Patient declined  Physical Activity: Not on  file  Stress: Not on file  Social Connections: Not on file  Intimate Partner Violence: Not on file     FAMILY HISTORY:  Family History  Problem Relation Age of Onset   Varicose Veins Mother    Hypertension Mother    Hyperlipidemia Father    Breast cancer Maternal Aunt    Breast cancer Maternal Grandmother    Varicose Veins Maternal Grandmother    Cancer Maternal Grandmother     Otherwise negative.   REVIEW OF SYSTEMS:  Review of Systems  Constitutional:  Negative for chills and fever.  HENT:  Negative for congestion and sore throat.   Respiratory:  Negative for cough and shortness of breath.   Cardiovascular:  Negative for chest pain and palpitations.  Gastrointestinal:  Positive for abdominal pain and nausea. Negative for constipation, diarrhea and vomiting.  Genitourinary:  Negative for dysuria and urgency.  Musculoskeletal:  Positive for back pain. Negative for myalgias.  All other systems reviewed and are negative.   VITAL SIGNS:  Temp:  [98.6 F (37 C)-98.7 F (37.1 C)] 98.6 F (37 C) (05/21 1018) Pulse Rate:  [60-77] 77 (05/21 1018) Resp:  [18] 18 (05/21 1018) BP: (114-130)/(68-85) 130/85 (05/21 1018) SpO2:  [98 %-100 %] 100 % (05/21 1018) Weight:  [72.6 kg] 72.6 kg (05/21 0540)     Height: 5' (152.4 cm) Weight: 72.6 kg BMI (Calculated): 31.25   PHYSICAL EXAM:  Physical Exam Vitals and nursing note reviewed. Exam conducted with a chaperone present.  Constitutional:      General: She is not in acute distress.    Appearance: She is well-developed and normal weight. She is not ill-appearing.  HENT:     Head: Normocephalic and atraumatic.  Eyes:     General: No scleral icterus.    Extraocular Movements: Extraocular movements intact.  Cardiovascular:     Rate and Rhythm: Normal rate.     Heart sounds: Normal heart sounds. No murmur heard. Pulmonary:     Effort: Pulmonary effort is normal. No respiratory distress.  Abdominal:     General: Abdomen is flat.  There is no distension.     Palpations: Abdomen is soft.     Tenderness: There is abdominal tenderness in the right upper quadrant and epigastric area. There is no guarding or rebound. Negative signs include Murphy's sign.  Genitourinary:    Comments: Deferred Skin:    General: Skin is warm and dry.     Coloration: Skin is not jaundiced.  Neurological:     General: No focal deficit present.     Mental Status: She is alert and oriented to person, place, and time.  Psychiatric:        Mood and Affect: Mood normal.        Behavior: Behavior normal.     INTAKE/OUTPUT:  This shift: No intake/output data recorded.  Last 2 shifts: @IOLAST2SHIFTS @  Labs:     Latest Ref Rng & Units 07/15/2023  5:43 AM 01/26/2023   12:52 PM 03/09/2015   11:38 AM  CBC  WBC 4.0 - 10.5 K/uL 9.3  9.7  8.3   Hemoglobin 12.0 - 15.0 g/dL 13.0  86.5  78.4   Hematocrit 36.0 - 46.0 % 40.3  44.6  39.9   Platelets 150 - 400 K/uL 342  371  287       Latest Ref Rng & Units 07/15/2023    5:43 AM 01/26/2023   12:52 PM 03/09/2015   11:38 AM  CMP  Glucose 70 - 99 mg/dL 696  295  93   BUN 6 - 20 mg/dL 10  16  12    Creatinine 0.44 - 1.00 mg/dL 2.84  1.32  4.40   Sodium 135 - 145 mmol/L 137  137  137   Potassium 3.5 - 5.1 mmol/L 4.0  4.0  3.9   Chloride 98 - 111 mmol/L 103  104  104   CO2 22 - 32 mmol/L 24  25  27    Calcium 8.9 - 10.3 mg/dL 9.0  9.4  9.2   Total Protein 6.5 - 8.1 g/dL 7.8   7.9   Total Bilirubin 0.0 - 1.2 mg/dL 1.0   0.9   Alkaline Phos 38 - 126 U/L 91   100   AST 15 - 41 U/L 32   18   ALT 0 - 44 U/L 29   17      Imaging studies:   RUQ US  (07/15/2023) personally reviewed with large stone and gallbladder wall thickening, and radiologist report reviewed below:   IMPRESSION: Circumferential gallbladder wall thickening with a single large gallstone in the gallbladder neck, measuring 2 cm. In the absence of pericholecystic fluid and a sonographic Murphy's sign, the study is equivocal for acute  cholecystitis. If concern persists for acute cholecystitis, a nuclear medicine hepatobiliary scan would be recommended. Alternative differential considerations include reactive changes from upper abdominal inflammation, such as from pancreatitis, acute hepatitis, or volume overload from either CHF or renal failure.   Assessment/Plan: (ICD-10's: K81.0) 43 y.o. female with likely cholecystitis, at the very least symptomatic cholelithiasis from large gallstone   - We will plan for robotic assisted laparoscopic cholecystectomy this afternoon with Dr Ofilia Benton pending OR/Anesthesia availability - All risks, benefits, and alternatives to above procedure(s) were discussed with the patient, all of her questions were answered to her expressed satisfaction, patient expresses she wishes to proceed, and informed consent was obtained.    - NPO for planned procedure - IV Abx (Ancef); on call to OR   - ICG on call to OR - Monitor abdominal examination; on-going bowel function  - Pain control prn; antiemetics prn - Okay to mobilize   - DVT prophylaxis; hold for surgery   All of the above findings and recommendations were discussed with the patient, and all of her questions were answered to her expressed satisfaction.  -- Apolonio Bay, PA-C [facility] 07/15/2023, 10:52 AM M-F: 7am - 4pm

## 2023-07-15 NOTE — ED Provider Notes (Signed)
 [name]    None    (approximate)   History   Abdominal Pain   HPI  Anne Bush is a 43 y.o. female history of varicose veins  Couple days ago had episode of right upper abdominal pain went away after taking ibuprofen.  This morning awoke with return of pain located in the mid to right upper abdomen radiates slightly towards her back.  No pain or burning with urination no nausea or vomiting.  Pain is somewhat dull but persistent in this area now.  No chest pain or trouble breathing.  Last menstrual cycle started about a week ago.  Denies lower abdominal pain      Physical Exam   Triage Vital Signs: ED Triage Vitals  Encounter Vitals Group     BP 07/15/23 0542 114/68     Systolic BP Percentile --      Diastolic BP Percentile --      Pulse Rate 07/15/23 0542 60     Resp 07/15/23 0542 18     Temp 07/15/23 0542 98.7 F (37.1 C)     Temp Source 07/15/23 0542 Oral     SpO2 07/15/23 0542 98 %     Weight 07/15/23 0540 160 lb (72.6 kg)     Height 07/15/23 0540 5' (1.524 m)     Head Circumference --      Peak Flow --      Pain Score 07/15/23 0540 10     Pain Loc --      Pain Education --      Exclude from Growth Chart --     Most recent vital signs: Vitals:   07/15/23 1010 07/15/23 1018  BP:  130/85  Pulse:  77  Resp:  18  Temp: 98.6 F (37 C) 98.6 F (37 C)  SpO2:  100%     General: Awake, no distress.  CV:  Good peripheral perfusion.  Resp:  Normal effort.   Abd:  No distention.  Mild to moderate focal tenderness in the right upper quadrant.  No tenderness in the lower abdomen or remaining quadrants bilateral. Other:     ED Results / Procedures / Treatments   Labs (all labs ordered are listed, but only abnormal results are displayed) Labs Reviewed  COMPREHENSIVE METABOLIC PANEL WITH GFR - Abnormal; Notable for the following components:      Result Value   Glucose, Bld 114 (*)    All other components  within normal limits  URINALYSIS, ROUTINE W REFLEX MICROSCOPIC - Abnormal; Notable for the following components:   Color, Urine YELLOW (*)    APPearance HAZY (*)    All other components within normal limits  POC URINE PREG, ED - Normal  LIPASE, BLOOD  CBC   Workup demonstrates normal CBC, normal comprehensive metabolic panel and lipase.  Reassuring LFTs in the setting of right upper quadrant pain.  RADIOLOGY  US  ABDOMEN LIMITED RUQ (LIVER/GB) Result Date: 07/15/2023 CLINICAL DATA:  [id number] Abdominal pain [id number] EXAM: ULTRASOUND ABDOMEN LIMITED RIGHT UPPER QUADRANT COMPARISON:  March 09, 2015 FINDINGS: Gallbladder: Single large gallstone in the gallbladder neck measuring 2 cm. Circumferential gallbladder wall thickening. No pericholecystic fluid. No sonographic Murphy's sign noted by sonographer. Common bile duct: Diameter: 2 mm Liver: Normal echogenicity. No focal lesion identified. No intrahepatic biliary ductal dilation. Portal vein is patent on color Doppler imaging with normal direction of blood flow towards the liver. Other: None. IMPRESSION: Circumferential gallbladder wall thickening with a  single large gallstone in the gallbladder neck, measuring 2 cm. In the absence of pericholecystic fluid and a sonographic Murphy's sign, the study is equivocal for acute cholecystitis. If concern persists for acute cholecystitis, a nuclear medicine hepatobiliary scan would be recommended. Alternative differential considerations include reactive changes from upper abdominal inflammation, such as from pancreatitis, acute hepatitis, or volume overload from either CHF or renal failure. Electronically Signed   By: Rance Burrows M.D.   On: 07/15/2023 08:20    Right upper quadrant ultrasound interpreted by me as grossly positive for a large gallstone   PROCEDURES:  Critical Care performed: No  Procedures   MEDICATIONS ORDERED IN ED: Medications  indocyanine green (IC-GREEN) injection 1.25 mg (has  no administration in time range)  ceFAZolin (ANCEF) IVPB 2g/100 mL premix (has no administration in time range)  0.9 %  sodium chloride  infusion (has no administration in time range)  ondansetron  (ZOFRAN ) injection 4 mg (has no administration in time range)  morphine  (PF) 2 MG/ML injection 2-4 mg (has no administration in time range)  acetaminophen (TYLENOL) tablet 650 mg (has no administration in time range)  ketorolac  (TORADOL ) 30 MG/ML injection 15 mg (15 mg Intramuscular Given 07/15/23 1014)     IMPRESSION / MDM / ASSESSMENT AND PLAN / ED COURSE  I reviewed the triage vital signs and the nursing notes.                              Differential diagnosis includes, but is not limited to, cholelithiasis, cholecystitis though seemingly less likely with no fever no elevated white count, choledocholithiasis though LFTs are normal, colonic causes, hepatobiliary, renal, etc.  No symptoms would be highly suggestive of acute urologic process or pyelonephritis.  Patient is allergic alert nontoxic.  Will obtain right upper quadrant ultrasound  Patient's presentation is most consistent with acute complicated illness / injury requiring diagnostic workup.      Clinical Course as of 07/15/23 1054  Wed Jul 15, 2023  1011 Consult paged to Dr. Ofilia Benton [MQ]    Clinical Course User Index [MQ] Iver Marker, MD   ----------------------------------------- 10:53 AM on 07/15/2023 ----------------------------------------- Patient resting comfortably.  Surgery providing consult with current plan for surgical treatment, possibly to the OR today per [name]. Admit to surgery team  FINAL CLINICAL IMPRESSION(S) / ED DIAGNOSES   Final diagnoses:  Calculus of gallbladder with acute cholecystitis without obstruction     Rx / DC Orders   ED Discharge Orders     None        Note:  This document was prepared using Dragon voice recognition software and may include unintentional dictation  errors.   Iver Marker, MD 07/15/23 1054

## 2023-07-16 ENCOUNTER — Telehealth: Payer: Self-pay | Admitting: Surgery

## 2023-07-16 LAB — SURGICAL PATHOLOGY

## 2023-07-16 NOTE — Anesthesia Postprocedure Evaluation (Signed)
 Anesthesia Post Note  Patient: Anne Bush  Procedure(s) Performed: CHOLECYSTECTOMY, ROBOT-ASSISTED, LAPAROSCOPIC  Patient location during evaluation: PACU Anesthesia Type: General Level of consciousness: awake and alert Pain management: pain level controlled Vital Signs Assessment: post-procedure vital signs reviewed and stable Respiratory status: spontaneous breathing, nonlabored ventilation, respiratory function stable and patient connected to nasal cannula oxygen Cardiovascular status: blood pressure returned to baseline and stable Postop Assessment: no apparent nausea or vomiting Anesthetic complications: no  No notable events documented.   Last Vitals:  Vitals:   07/15/23 1613 07/15/23 1738  BP:  110/75  Pulse: 67 63  Resp: 16 18  Temp:  (!) 36.3 C  SpO2: 98% 98%    Last Pain:  Vitals:   07/15/23 1738  TempSrc: Temporal  PainSc: 3                  Enrique Harvest

## 2023-07-16 NOTE — Telephone Encounter (Signed)
 Pt said she is asking for a note saying she has an appt for her followup and she is requesting one also to be able to go back to work before she comes  in for her post op. Please advise and call her at [phone]

## 2023-07-22 ENCOUNTER — Ambulatory Visit
Admission: RE | Admit: 2023-07-22 | Discharge: 2023-07-22 | Disposition: A | Discharge status: Home / Self Care | Source: Ambulatory Visit | Attending: Nurse Practitioner | Admitting: Nurse Practitioner

## 2023-07-22 DIAGNOSIS — N6489 Other specified disorders of breast: Secondary | ICD-10-CM | POA: Diagnosis present

## 2023-07-22 DIAGNOSIS — R921 Mammographic calcification found on diagnostic imaging of breast: Secondary | ICD-10-CM | POA: Insufficient documentation

## 2023-08-06 ENCOUNTER — Encounter: Payer: Self-pay | Admitting: Surgery

## 2023-08-06 ENCOUNTER — Ambulatory Visit: Admitting: Surgery

## 2023-08-06 VITALS — BP 113/70 | HR 69 | Temp 98.6°F | Ht 61.0 in | Wt 153.0 lb

## 2023-08-06 DIAGNOSIS — Z09 Encounter for follow-up examination after completed treatment for conditions other than malignant neoplasm: Secondary | ICD-10-CM

## 2023-08-06 DIAGNOSIS — K8000 Calculus of gallbladder with acute cholecystitis without obstruction: Secondary | ICD-10-CM

## 2023-08-06 DIAGNOSIS — Z9049 Acquired absence of other specified parts of digestive tract: Secondary | ICD-10-CM | POA: Insufficient documentation

## 2023-08-06 NOTE — Progress Notes (Signed)
 [facility] POST-OP OFFICE VISIT  08/06/2023  HPI: Anne Bush is a 43 y.o. female had surgery, now s/p robotic cholecystectomy.  She reports good tolerance of food, good appetite.  She reports regular bowel movements.  She denies abdominal pain.  Denies fevers and chills.  Vital signs: BP 113/70   Pulse 69   Temp 98.6 F (37 C) (Oral)   Ht 5' 1 (1.549 m)   Wt 153 lb (69.4 kg)   LMP 07/04/2023   SpO2 98%   BMI 28.91 kg/m    Physical Exam: Constitutional: She appears well Abdomen: Soft nontender. Skin: All the incisions are clean dry and intact without ecchymosis or mass.  Assessment/Plan: This is a 43 y.o. female status post robotic cholecystectomy  Patient Active Problem List   Diagnosis Date Noted   Cholecystitis, acute 07/15/2023   Calculus of gallbladder with acute cholecystitis without obstruction 07/15/2023   Chronic venous insufficiency 06/29/2017   Varicose veins of bilateral lower extremities with pain 06/29/2017    - Follow-up as needed, resume activity without restrictions.   Flynn Hylan M.D., FACS 08/06/2023, 9:53 PM

## 2023-08-06 NOTE — Patient Instructions (Signed)
